# Patient Record
Sex: Male | Born: 1970 | Race: Black or African American | Hispanic: No | Marital: Single | Smoking: Never smoker
Health system: Southern US, Community
[De-identification: ages and names within clinical notes are randomized; demographics above are authoritative.]

## PROBLEM LIST (undated history)

## (undated) DIAGNOSIS — M1990 Unspecified osteoarthritis, unspecified site: Secondary | ICD-10-CM

## (undated) DIAGNOSIS — J309 Allergic rhinitis, unspecified: Secondary | ICD-10-CM

## (undated) DIAGNOSIS — M779 Enthesopathy, unspecified: Secondary | ICD-10-CM

## (undated) HISTORY — PX: HAND SURGERY: SHX662

## (undated) HISTORY — DX: Unspecified osteoarthritis, unspecified site: M19.90

## (undated) HISTORY — DX: Enthesopathy, unspecified: M77.9

## (undated) HISTORY — DX: Allergic rhinitis, unspecified: J30.9

## (undated) HISTORY — PX: BACK SURGERY: SHX140

---

## 2015-05-26 ENCOUNTER — Other Ambulatory Visit: Payer: Self-pay | Admitting: Orthopaedic Surgery

## 2015-05-26 DIAGNOSIS — M79642 Pain in left hand: Secondary | ICD-10-CM

## 2015-05-30 ENCOUNTER — Ambulatory Visit
Admission: RE | Admit: 2015-05-30 | Discharge: 2015-05-30 | Disposition: A | Discharge status: Home / Self Care | Payer: Non-veteran care | Source: Ambulatory Visit | Attending: Orthopaedic Surgery | Admitting: Orthopaedic Surgery

## 2015-05-30 DIAGNOSIS — M79642 Pain in left hand: Secondary | ICD-10-CM

## 2015-05-30 MED ORDER — GADOBENATE DIMEGLUMINE 529 MG/ML IV SOLN: 18.0000 mL | Freq: Once | INTRAVENOUS | Status: DC | PRN

## 2015-12-07 ENCOUNTER — Other Ambulatory Visit: Payer: Self-pay | Admitting: Orthopaedic Surgery

## 2015-12-07 DIAGNOSIS — M25511 Pain in right shoulder: Secondary | ICD-10-CM

## 2015-12-12 ENCOUNTER — Ambulatory Visit
Admission: RE | Admit: 2015-12-12 | Discharge: 2015-12-12 | Disposition: A | Discharge status: Home / Self Care | Payer: Non-veteran care | Source: Ambulatory Visit | Attending: Orthopaedic Surgery | Admitting: Orthopaedic Surgery

## 2015-12-12 DIAGNOSIS — M25511 Pain in right shoulder: Secondary | ICD-10-CM

## 2016-02-20 ENCOUNTER — Other Ambulatory Visit: Payer: Self-pay | Admitting: Orthopaedic Surgery

## 2016-02-20 DIAGNOSIS — M542 Cervicalgia: Secondary | ICD-10-CM

## 2016-02-28 ENCOUNTER — Ambulatory Visit
Admission: RE | Admit: 2016-02-28 | Discharge: 2016-02-28 | Disposition: A | Discharge status: Home / Self Care | Payer: Non-veteran care | Source: Ambulatory Visit | Attending: Orthopaedic Surgery | Admitting: Orthopaedic Surgery

## 2016-02-28 DIAGNOSIS — M542 Cervicalgia: Secondary | ICD-10-CM

## 2016-07-29 ENCOUNTER — Ambulatory Visit (INDEPENDENT_AMBULATORY_CARE_PROVIDER_SITE_OTHER): Payer: No Typology Code available for payment source | Admitting: Orthopaedic Surgery

## 2016-07-29 ENCOUNTER — Encounter (INDEPENDENT_AMBULATORY_CARE_PROVIDER_SITE_OTHER): Payer: Self-pay | Admitting: Orthopaedic Surgery

## 2016-07-29 DIAGNOSIS — M5412 Radiculopathy, cervical region: Secondary | ICD-10-CM | POA: Diagnosis not present

## 2016-07-29 DIAGNOSIS — M7541 Impingement syndrome of right shoulder: Secondary | ICD-10-CM

## 2016-07-29 MED ORDER — METHYLPREDNISOLONE ACETATE 40 MG/ML IJ SUSP
13.3300 mg | INTRAMUSCULAR | Status: AC | PRN
  Administered 2016-07-29: 13.33 mg via INTRA_ARTICULAR

## 2016-07-29 NOTE — Progress Notes (Signed)
   Office Visit Note   Patient: Jason Dougherty           Date of Birth: 11/11/1970           MRN: [id number] Visit Date: 07/29/2016              Requested by: No referring provider defined for this encounter. PCP: Dorene GrebeSEKHON, MANHARPRETT, MD   Assessment & Plan: Visit Diagnoses:  1. Impingement syndrome of right shoulder   2. Cervical radiculopathy     Plan: Acromioclavicular joint joint injection was performed today under sterile conditions patient tolerated this well. We'll refer to Dr. Alvester MorinNewton for another epidural steroid injection. Follow up with me as needed.  Follow-Up Instructions: Return if symptoms worsen or fail to improve.   Orders:  Orders Placed This Encounter  Procedures  . Ambulatory referral to Physical Medicine Rehab   No orders of the defined types were placed in this encounter.     Procedures: Medium Joint Inj Date/Time: 07/29/2016 6:12 PM Performed by: Tarry KosXU, NAIPING M Authorized by: Tarry KosXU, NAIPING M   Consent Given by:  Patient Indications:  Pain Location:  Shoulder Site:  R acromioclavicular Needle Size:  22 G Ultrasound Guided: No   Fluoroscopic Guidance: No   Medications:  13.33 mg methylPREDNISolone acetate 40 MG/ML     Clinical Data: No additional findings.   Subjective: Chief Complaint  Patient presents with  . Right Shoulder - Pain, Follow-up  . Neck - Pain, Follow-up    Mr. Jason Dougherty comes in for continued right shoulder pain. He had a good response to an acromioclavicular joint with Dr. Berline Choughigby in the past. He is also having pain that radiates down from his neck. He did have partial relief from an epidural steroid injection Dr. Alvester MorinNewton also. His pain is worse with turning his neck. He denies any new symptoms or trauma.    Review of Systems   Objective: Vital Signs: There were no vitals taken for this visit.  Physical Exam  Ortho Exam Exam is stable. Specialty Comments:  No specialty comments available.  Imaging: No results  found.   PMFS History: There are no active problems to display for this patient.  No past medical history on file.  No family history on file.  No past surgical history on file. Social History   Occupational History  . Not on file.   Social History Main Topics  . Smoking status: Never Smoker  . Smokeless tobacco: Never Used  . Alcohol use Not on file  . Drug use: Unknown  . Sexual activity: Not on file

## 2016-08-15 ENCOUNTER — Telehealth (INDEPENDENT_AMBULATORY_CARE_PROVIDER_SITE_OTHER): Payer: Self-pay | Admitting: Orthopaedic Surgery

## 2016-08-15 DIAGNOSIS — M542 Cervicalgia: Secondary | ICD-10-CM

## 2016-08-15 NOTE — Telephone Encounter (Signed)
Please schedule him for another ESI with newton.

## 2016-08-15 NOTE — Telephone Encounter (Signed)
See message below °

## 2016-08-15 NOTE — Telephone Encounter (Signed)
Pt got a shot in shoulder a few wks ago and saw dr Roda Shuttersxu  and stated we were suppose to schedule him another inj in neck, please advise  [phone]

## 2016-08-16 NOTE — Telephone Encounter (Signed)
Order put in.

## 2016-08-21 ENCOUNTER — Telehealth (INDEPENDENT_AMBULATORY_CARE_PROVIDER_SITE_OTHER): Payer: Self-pay | Admitting: *Deleted

## 2016-08-21 NOTE — Telephone Encounter (Signed)
Aquaphor ointment at night with cotton gloves.  Vanicream during the day

## 2016-08-21 NOTE — Telephone Encounter (Signed)
Pt called stating his hand is dry and cracking and bleeding, pt wanting some advice for this

## 2016-08-21 NOTE — Telephone Encounter (Signed)
Please advise 

## 2016-08-22 NOTE — Telephone Encounter (Signed)
Called pt to advise

## 2016-12-11 ENCOUNTER — Telehealth (INDEPENDENT_AMBULATORY_CARE_PROVIDER_SITE_OTHER): Payer: Self-pay | Admitting: Orthopaedic Surgery

## 2016-12-11 NOTE — Telephone Encounter (Signed)
[facility]

## 2016-12-12 ENCOUNTER — Telehealth (INDEPENDENT_AMBULATORY_CARE_PROVIDER_SITE_OTHER): Payer: Self-pay | Admitting: Orthopaedic Surgery

## 2016-12-12 NOTE — Telephone Encounter (Signed)
RECORDS REFAXED TO [facility] AS LESLIE EMAILED ME STATED THEY DID NOT RECEIVE RECORD THAT I FAXED ON 11/06/16

## 2016-12-23 ENCOUNTER — Telehealth (INDEPENDENT_AMBULATORY_CARE_PROVIDER_SITE_OTHER): Payer: Self-pay | Admitting: Orthopaedic Surgery

## 2016-12-23 NOTE — Telephone Encounter (Signed)
Candace @ [facility] called needing records on cervical spine, faxed to her [phone]

## 2017-07-24 ENCOUNTER — Encounter: Payer: Self-pay | Admitting: Pulmonary Disease

## 2017-07-24 ENCOUNTER — Ambulatory Visit (INDEPENDENT_AMBULATORY_CARE_PROVIDER_SITE_OTHER): Payer: Non-veteran care | Admitting: Pulmonary Disease

## 2017-07-24 ENCOUNTER — Telehealth: Payer: Self-pay | Admitting: Pulmonary Disease

## 2017-07-24 VITALS — BP 126/72 | HR 66 | Ht 67.0 in | Wt 207.0 lb

## 2017-07-24 DIAGNOSIS — R0602 Shortness of breath: Secondary | ICD-10-CM | POA: Diagnosis not present

## 2017-07-24 NOTE — Patient Instructions (Signed)
We will schedule you for pulmonary function test I have reviewed your CT which does not show any acute lung abnormality We will try to track down the [facility]. I will be in touch with you shortly Return to clinic in 1 month

## 2017-07-24 NOTE — Progress Notes (Addendum)
Jason Dougherty    [id number]    01/01/1971  Primary Care Physician:Sekhon, Manharprett, MD  Referring Physician: Dorene GrebeSekhon, Manharprett, MD [address] [facility] [place], [place] [id number]  Chief complaint: Consult for evaluation of hiccups, burping  HPI: 46 year old with past medical history of allergic rhinitis, sleep apnea.  He reports he comes in burping which started in August 2018 after steroid injection in the right shoulder.  History significant for acid reflux with esophagitis and hiatal hernia.  [facility] and has been tried on multiple antiacid medication and had a EGD done which showed mild reflux per patient.  He has been referred to pulmonary for evaluation given his ongoing symptoms.  He has symptoms of mild dyspnea with activity and at rest.  Denies any cough, sputum production, wheezing.  He had a CT scan chest abdomen pelvis on 07/18/17 but has not been told the results yet.  He has history of sleep apnea and is on CPAP for many years.  He [facility].  Pets: No pets Occupation: Was in [employer].  Deployed to [place].  Currently unemployed Exposures: No known exposures, no mold at home Smoking history: Never smoker Travel History: Not significant  Outpatient Encounter Medications as of 07/24/2017  Medication Sig  . cetirizine (ZYRTEC) 10 MG tablet Take 10 mg by mouth.  . cyclobenzaprine (FLEXERIL) 10 MG tablet Take 10 mg by mouth.  . diclofenac sodium (VOLTAREN) 1 % GEL Apply 2 g topically.  Marland Kitchen. diltiazem (CARDIZEM) 25 MG/5ML SOLN injection Place rectally.  Marland Kitchen. diltiazem 2 % GEL Please provide one cane to assist with ambulation.  Marland Kitchen. esomeprazole (NEXIUM) 40 MG capsule Take 40 mg by mouth.  . venlafaxine (EFFEXOR) 75 MG tablet Take 75 mg by mouth.  . prazosin (MINIPRESS) 2 MG capsule Take 2 mg by mouth.  . sildenafil (VIAGRA) 100 MG tablet Take 100 mg by mouth.  . tamsulosin (FLOMAX) 0.4  MG CAPS capsule Take 0.4 mg by mouth.   No facility-administered encounter medications on file as of 07/24/2017.     Allergies as of 07/24/2017 - Review Complete 07/24/2017  Allergen Reaction Noted  . Duloxetine Other (See Comments) 10/29/2016    Past Medical History:  Diagnosis Date  . Allergic rhinitis   . Arthritis   . Bone spur     Past Surgical History:  Procedure Laterality Date  . BACK SURGERY    . HAND SURGERY      Family History  Problem Relation Age of Onset  . Diabetes Mother   . Hypertension Sister   . Diabetes Maternal Grandmother     Social History   Socioeconomic History  . Marital status: Single    Spouse name: Not on file  . Number of children: Not on file  . Years of education: Not on file  . Highest education level: Not on file  Social Needs  . Financial resource strain: Not on file  . Food insecurity - worry: Not on file  . Food insecurity - inability: Not on file  . Transportation needs - medical: Not on file  . Transportation needs - non-medical: Not on file  Occupational History  . Not on file  Tobacco Use  . Smoking status: Never Smoker  . Smokeless tobacco: Never Used  Substance and Sexual Activity  . Alcohol use: No    Frequency: Never  . Drug use: No  .  Sexual activity: Not on file  Other Topics Concern  . Not on file  Social History Narrative  . Not on file   Review of systems: Review of Systems  Constitutional: Negative for fever and chills.  HENT: Negative.   Eyes: Negative for blurred vision.  Respiratory: as per HPI  Cardiovascular: Negative for chest pain and palpitations.  Gastrointestinal: Negative for vomiting, diarrhea, blood per rectum. Genitourinary: Negative for dysuria, urgency, frequency and hematuria.  Musculoskeletal: Negative for myalgias, back pain and joint pain.  Skin: Negative for itching and rash.  Neurological: Negative for dizziness, tremors, focal weakness, seizures and loss of consciousness.    Endo/Heme/Allergies: Negative for environmental allergies.  Psychiatric/Behavioral: Negative for depression, suicidal ideas and hallucinations.  All other systems reviewed and are negative.  Physical Exam: Blood pressure 126/72, pulse 66, height 5\' 7"  (1.702 m), weight 207 lb (93.9 kg), SpO2 97 %. Gen:      No acute distress HEENT:  EOMI, sclera anicteric Neck:     No masses; no thyromegaly Lungs:    Clear to auscultation bilaterally; normal respiratory effort CV:         Regular rate and rhythm; no murmurs Abd:      + bowel sounds; soft, non-tender; no palpable masses, no distension Ext:    No edema; adequate peripheral perfusion Skin:      Warm and dry; no rash Neuro: alert and oriented x 3 Psych: normal mood and affect  Data Reviewed: CT chest abdomen pelvis 07/18/17-images reviewed which do not show any acute lung abnormality.  Awaiting radiology report. CT head 04/15/17- prominent bilateral basal ganglia for patient's age.  This is of unclear significance but may indicate underlying metabolic disorder such as hypoparathyroidism.  Assessment:  Assessment for hiccups, burping Not clear if there is a lung etiology for this.  He has no smoking history or exposures and no symptoms suggestive of asthma I have reviewed the CT [facility] which he [facility] a disc.  By my review there is no lung abnormality.  There seems to be stool-filled colon in the abdomen. [facility] the [facility] to get [facility] read on this  Suspect that symptoms are from ongoing acid reflux and gastritis and [facility]  Schedule him for pulmonary function test with supine and prone spirometry to evaluate diaphragm function and a sniff test for evaluation of diaphragm movement.  Plan/Recommendations: - PFTs with supine and prone spirometry - Sniff test  Chilton GreathousePraveen Merinda Victorino MD [facility] Pager [phone] 07/24/2017, 10:42 AM  CC: Dorene GrebeSekhon,  Manharprett, MD   Addendum: [facility] No suspicious nodule, consolidative change, pleural effusion or pneumothorax is seen.  Mild enlargement of the heart Subcentimeter hypodensities within the liver too small to characterize likely small cysts or hemangiomas No bowel obstruction.  Increased stool burden is seen throughout the colon.  Prostate appears prominent in size for patient's stated age.  There is no obvious reason on the scan for patient's hiccups.  Chilton GreathousePraveen Alyannah Sanks MD  Pulmonary and Critical Care Pager [phone] If no answer or after 3pm call: [phone] 07/31/2017, 2:45 PM

## 2017-07-24 NOTE — Telephone Encounter (Signed)
Note opened in error.

## 2017-07-25 ENCOUNTER — Ambulatory Visit
Admission: RE | Admit: 2017-07-25 | Discharge: 2017-07-25 | Disposition: A | Discharge status: Home / Self Care | Payer: Self-pay | Source: Ambulatory Visit | Attending: Pulmonary Disease | Admitting: Pulmonary Disease

## 2017-07-25 DIAGNOSIS — R0602 Shortness of breath: Secondary | ICD-10-CM

## 2017-07-27 ENCOUNTER — Encounter: Payer: Self-pay | Admitting: Pulmonary Disease

## 2017-07-29 ENCOUNTER — Other Ambulatory Visit: Payer: Self-pay | Admitting: Pulmonary Disease

## 2017-07-29 ENCOUNTER — Telehealth: Payer: Self-pay | Admitting: Pulmonary Disease

## 2017-07-29 ENCOUNTER — Ambulatory Visit
Admission: RE | Admit: 2017-07-29 | Discharge: 2017-07-29 | Disposition: A | Discharge status: Home / Self Care | Payer: Self-pay | Source: Ambulatory Visit | Attending: Pulmonary Disease | Admitting: Pulmonary Disease

## 2017-07-29 DIAGNOSIS — R0602 Shortness of breath: Secondary | ICD-10-CM

## 2017-07-29 NOTE — Telephone Encounter (Signed)
I changed the order and advised Stacy at [place] CT but advised her that I couldn't delete the previous order because a message came up that the exam had began so I couldn't delete it. Nothing further is needed.

## 2017-07-30 ENCOUNTER — Other Ambulatory Visit: Payer: Self-pay

## 2017-07-30 ENCOUNTER — Telehealth: Payer: Self-pay

## 2017-07-30 DIAGNOSIS — R0602 Shortness of breath: Secondary | ICD-10-CM

## 2017-07-30 NOTE — Telephone Encounter (Signed)
Order has been place for PFT and sniff test. Pt is aware and voiced his understanding. Nothing further is needed

## 2017-07-30 NOTE — Telephone Encounter (Signed)
-----   Message from Chilton GreathousePraveen Mannam, MD sent at 07/27/2017 10:20 PM EST ----- Patient with need PFTs with supine and prone spirometry and sniff test for evaluation of diaphragm function. Please order. Thanks

## 2017-08-06 ENCOUNTER — Ambulatory Visit (HOSPITAL_COMMUNITY)
Admission: RE | Admit: 2017-08-06 | Discharge: 2017-08-06 | Disposition: A | Discharge status: Home / Self Care | Payer: Non-veteran care | Source: Ambulatory Visit | Attending: Pulmonary Disease | Admitting: Pulmonary Disease

## 2017-08-06 DIAGNOSIS — R0602 Shortness of breath: Secondary | ICD-10-CM

## 2017-08-06 LAB — PULMONARY FUNCTION TEST
DL/VA % PRED: 86 %
DL/VA: 3.86 ml/min/mmHg/L
DLCO UNC % PRED: 76 %
DLCO unc: 21.68 ml/min/mmHg
FEF 25-75 POST: 4.46 L/s
FEF 25-75 PRE: 5.35 L/s
FEF2575-%Change-Post: -16 %
FEF2575-%PRED-PRE: 170 %
FEF2575-%Pred-Post: 141 %
FEV1-%Change-Post: -5 %
FEV1-%Pred-Post: 114 %
FEV1-%Pred-Pre: 121 %
FEV1-PRE: 3.71 L
FEV1-Post: 3.48 L
FEV1FVC-%CHANGE-POST: 2 %
FEV1FVC-%Pred-Pre: 110 %
FEV6-%CHANGE-POST: -8 %
FEV6-%PRED-POST: 104 %
FEV6-%PRED-PRE: 113 %
FEV6-Post: 3.86 L
FEV6-Pre: 4.2 L
FEV6FVC-%Pred-Post: 103 %
FEV6FVC-%Pred-Pre: 103 %
FVC-%Change-Post: -8 %
FVC-%Pred-Post: 101 %
FVC-%Pred-Pre: 110 %
FVC-Post: 3.86 L
FVC-Pre: 4.2 L
POST FEV1/FVC RATIO: 90 %
POST FEV6/FVC RATIO: 100 %
PRE FEV1/FVC RATIO: 88 %
Pre FEV6/FVC Ratio: 100 %
RV % pred: 51 %
RV: 0.95 L
TLC % PRED: 83 %
TLC: 5.28 L

## 2017-08-06 MED ORDER — ALBUTEROL SULFATE (2.5 MG/3ML) 0.083% IN NEBU
2.5000 mg | INHALATION_SOLUTION | Freq: Once | RESPIRATORY_TRACT | Status: AC
  Administered 2017-08-06: 2.5 mg via RESPIRATORY_TRACT

## 2017-08-15 ENCOUNTER — Telehealth: Payer: Self-pay | Admitting: Pulmonary Disease

## 2017-08-15 ENCOUNTER — Telehealth: Payer: Self-pay | Admitting: Pharmacy Technician

## 2017-08-15 NOTE — Telephone Encounter (Signed)
PM please advise on results for xray and PFT.

## 2017-08-15 NOTE — Telephone Encounter (Signed)
Duplicate message. 

## 2017-08-19 NOTE — Telephone Encounter (Signed)
Pt is aware of results and voiced his understanding. Nothing further is needed.  

## 2017-08-19 NOTE — Telephone Encounter (Signed)
Please let the patient know that the PFTs do not show any abnormality. We were looking for diaphragm weakness as a cause of his hiccups but there is no evidence of that on the tests. I will discuss further on return visit.   Chilton GreathousePraveen Yoshio Seliga MD [place] [facility] Pager [phone] 08/19/2017, 10:35 AM

## 2017-09-02 ENCOUNTER — Ambulatory Visit (INDEPENDENT_AMBULATORY_CARE_PROVIDER_SITE_OTHER): Payer: No Typology Code available for payment source | Admitting: Pulmonary Disease

## 2017-09-02 ENCOUNTER — Encounter: Payer: Self-pay | Admitting: Pulmonary Disease

## 2017-09-02 VITALS — BP 116/80 | HR 65 | Ht 67.0 in | Wt 209.8 lb

## 2017-09-02 DIAGNOSIS — R066 Hiccough: Secondary | ICD-10-CM | POA: Diagnosis not present

## 2017-09-02 NOTE — Patient Instructions (Signed)
I have reviewed the results of your test which do not indicate any lung reason for your hiccups Please follow-up with your primary care.  You can follow-up with us as needed.

## 2017-09-02 NOTE — Progress Notes (Signed)
Jason Dougherty    [id number]    1971/08/09  Primary Care Physician:Sekhon, Manharprett, MD  Referring Physician: Dorene Grebe, MD [address] [place], [place] [address]  Chief complaint: Follow-up for hiccups, burping   HPI: 47 year old with past medical history of allergic rhinitis, sleep apnea.  He reports he comes in burping which started in August 2018 after steroid injection in the right shoulder.  History significant for acid reflux with esophagitis and hiatal hernia.  He has been referred to gastroenterology at the [facility] and has been tried on multiple antiacid medication and had a EGD done which showed mild reflux per patient.  He has been referred to pulmonary for evaluation given his ongoing symptoms.  He has symptoms of mild dyspnea with activity and at rest.  Denies any cough, sputum production, wheezing.  He had a CT scan chest abdomen pelvis on 07/18/17 but has not been told the results yet.  He has history of sleep apnea and is on CPAP for many years.  He [place] the [place]  Pets: No pets Occupation: Was in [employer].  Deployed to [place].  Currently unemployed Exposures: No known exposures, no mold at home Smoking history: Never smoker Travel History: Not significant  Outpatient Encounter Medications as of 09/02/2017  Medication Sig  . cetirizine (ZYRTEC) 10 MG tablet Take 10 mg by mouth.  . cyclobenzaprine (FLEXERIL) 10 MG tablet Take 10 mg by mouth.  . diclofenac sodium (VOLTAREN) 1 % GEL Apply 2 g topically.  Marland Kitchen diltiazem (CARDIZEM) 25 MG/5ML SOLN injection Place rectally.  Marland Kitchen diltiazem 2 % GEL Please provide one cane to assist with ambulation.  Marland Kitchen esomeprazole (NEXIUM) 40 MG capsule Take 40 mg by mouth.  . gabapentin (NEURONTIN) 300 MG capsule Take 300 mg by mouth 2 (two) times daily.  . prazosin (MINIPRESS) 2 MG capsule Take 2 mg by mouth.  . propranolol (INDERAL) 10 MG tablet Take 10 mg by mouth daily.  . sildenafil (VIAGRA)  100 MG tablet Take 100 mg by mouth.  . tamsulosin (FLOMAX) 0.4 MG CAPS capsule Take 0.4 mg by mouth.  . venlafaxine (EFFEXOR) 75 MG tablet Take 75 mg by mouth.   No facility-administered encounter medications on file as of 09/02/2017.     Allergies as of 09/02/2017 - Review Complete 09/02/2017  Allergen Reaction Noted  . Duloxetine Other (See Comments) 10/29/2016    Past Medical History:  Diagnosis Date  . Allergic rhinitis   . Arthritis   . Bone spur     Past Surgical History:  Procedure Laterality Date  . BACK SURGERY    . HAND SURGERY      Family History  Problem Relation Age of Onset  . Diabetes Mother   . Hypertension Sister   . Diabetes Maternal Grandmother     Social History   Socioeconomic History  . Marital status: Single    Spouse name: Not on file  . Number of children: Not on file  . Years of education: Not on file  . Highest education level: Not on file  Social Needs  . Financial resource strain: Not on file  . Food insecurity - worry: Not on file  . Food insecurity - inability: Not on file  . Transportation needs - medical: Not on file  . Transportation needs - non-medical: Not on file  Occupational History  . Not on file  Tobacco Use  . Smoking status: Never Smoker  .  Smokeless tobacco: Never Used  Substance and Sexual Activity  . Alcohol use: No    Frequency: Never  . Drug use: No  . Sexual activity: Not on file  Other Topics Concern  . Not on file  Social History Narrative  . Not on file   Review of systems: Review of Systems  Constitutional: Negative for fever and chills.  HENT: Negative.   Eyes: Negative for blurred vision.  Respiratory: as per HPI  Cardiovascular: Negative for chest pain and palpitations.  Gastrointestinal: Negative for vomiting, diarrhea, blood per rectum. Genitourinary: Negative for dysuria, urgency, frequency and hematuria.  Musculoskeletal: Negative for myalgias, back pain and joint pain.  Skin: Negative  for itching and rash.  Neurological: Negative for dizziness, tremors, focal weakness, seizures and loss of consciousness.  Endo/Heme/Allergies: Negative for environmental allergies.  Psychiatric/Behavioral: Negative for depression, suicidal ideas and hallucinations.  All other systems reviewed and are negative.  Physical Exam: Blood pressure 116/80, pulse 65, height 5\' 7"  (1.702 m), weight 209 lb 12.8 oz (95.2 kg), SpO2 99 %. Gen:      No acute distress HEENT:  EOMI, sclera anicteric Neck:     No masses; no thyromegaly Lungs:    Clear to auscultation bilaterally; normal respiratory effort CV:         Regular rate and rhythm; no murmurs Abd:      + bowel sounds; soft, non-tender; no palpable masses, no distension Ext:    No edema; adequate peripheral perfusion Skin:      Warm and dry; no rash Neuro: alert and oriented x 3 Psych: normal mood and affect  Data Reviewed:  CT head 04/15/17- prominent bilateral basal ganglia for patient's age.  This is of unclear significance but may indicate underlying metabolic disorder such as hypoparathyroidism.   [facility] 07[facility]18[facility]17 No suspicious nodule, consolidative change, pleural effusion or pneumothorax is seen.  Mild enlargement of the heart Subcentimeter hypodensities within the liver too small to characterize likely small cysts or hemangiomas No bowel obstruction.  Increased stool burden is seen throughout the colon.  Prostate appears prominent in size for patient's stated age. There is no obvious reason on the scan for patient's hiccups.  I reviewed the images personally  Sniff test 08/06/17-normal diaphragm movement PFTs 08/06/17- FVC 3.86 [101%], FEV1 2.48 [114%], F/F 90, TLC 83%, DLCO 76%, DLCO/VA 86% Minimal diffusion defect which corrects for alveolar volume  FVC sitting 3.77 L, FVC supine 3.57 L  [percentage change 5%], FVC prone 3.0 L [percent change 20%]  Assessment:  Assessment for hiccups, burping Not clear  if there is a lung etiology for this.  He has no smoking history or exposures and no symptoms suggestive of asthma I have reviewed the CT scan chest abdomen [facility] which does not show any lung abnormality.  There is no evidence of diaphragm dysfunction either on the sniff test or pulmonary function tests.  FVC did not change significantly from sitting to supine position. Suspect that symptoms are from ongoing acid reflux and gastritis and he follows [facility]. Recommend that he continues his antiacid medication. Can consider symptomatic treatment with baclofen, gabapentin or reglan  OSA  Stable on CPAP.  [facility].  Plan[facility]Recommendations: [facility] Return to clinic as needed.   Chilton GreathousePraveen Izyan Ezzell MD [facility] Pager [phone] 09/02/2017, 11:17 AM  CC: Dorene GrebeSekhon, Manharprett, MD

## 2021-07-18 ENCOUNTER — Inpatient Hospital Stay (HOSPITAL_COMMUNITY)
Admission: EM | Admit: 2021-07-18 | Discharge: 2021-07-24 | DRG: 418 | Disposition: A | Discharge status: Home / Self Care | Payer: No Typology Code available for payment source | Attending: Family Medicine | Admitting: Family Medicine

## 2021-07-18 ENCOUNTER — Other Ambulatory Visit: Payer: Self-pay

## 2021-07-18 ENCOUNTER — Emergency Department (HOSPITAL_COMMUNITY): Payer: No Typology Code available for payment source

## 2021-07-18 ENCOUNTER — Encounter (HOSPITAL_COMMUNITY): Payer: Self-pay | Admitting: Oncology

## 2021-07-18 DIAGNOSIS — Z79899 Other long term (current) drug therapy: Secondary | ICD-10-CM

## 2021-07-18 DIAGNOSIS — Z888 Allergy status to other drugs, medicaments and biological substances status: Secondary | ICD-10-CM

## 2021-07-18 DIAGNOSIS — D696 Thrombocytopenia, unspecified: Secondary | ICD-10-CM | POA: Diagnosis present

## 2021-07-18 DIAGNOSIS — B001 Herpesviral vesicular dermatitis: Secondary | ICD-10-CM | POA: Diagnosis present

## 2021-07-18 DIAGNOSIS — Z9114 Patient's other noncompliance with medication regimen: Secondary | ICD-10-CM

## 2021-07-18 DIAGNOSIS — Z8249 Family history of ischemic heart disease and other diseases of the circulatory system: Secondary | ICD-10-CM | POA: Diagnosis not present

## 2021-07-18 DIAGNOSIS — K851 Biliary acute pancreatitis without necrosis or infection: Principal | ICD-10-CM | POA: Diagnosis present

## 2021-07-18 DIAGNOSIS — Z20822 Contact with and (suspected) exposure to covid-19: Secondary | ICD-10-CM | POA: Diagnosis present

## 2021-07-18 DIAGNOSIS — K859 Acute pancreatitis without necrosis or infection, unspecified: Secondary | ICD-10-CM | POA: Diagnosis present

## 2021-07-18 DIAGNOSIS — K8010 Calculus of gallbladder with chronic cholecystitis without obstruction: Secondary | ICD-10-CM | POA: Diagnosis present

## 2021-07-18 DIAGNOSIS — K8020 Calculus of gallbladder without cholecystitis without obstruction: Secondary | ICD-10-CM

## 2021-07-18 DIAGNOSIS — R7989 Other specified abnormal findings of blood chemistry: Secondary | ICD-10-CM | POA: Diagnosis not present

## 2021-07-18 DIAGNOSIS — I10 Essential (primary) hypertension: Secondary | ICD-10-CM | POA: Diagnosis present

## 2021-07-18 DIAGNOSIS — D6959 Other secondary thrombocytopenia: Secondary | ICD-10-CM | POA: Diagnosis present

## 2021-07-18 DIAGNOSIS — K219 Gastro-esophageal reflux disease without esophagitis: Secondary | ICD-10-CM | POA: Diagnosis present

## 2021-07-18 LAB — URINALYSIS, ROUTINE W REFLEX MICROSCOPIC
Bacteria, UA: NONE SEEN
Glucose, UA: NEGATIVE mg/dL
Hgb urine dipstick: NEGATIVE
Leukocytes,Ua: NEGATIVE
Nitrite: NEGATIVE
Protein, ur: NEGATIVE mg/dL
Specific Gravity, Urine: <=1.005 (ref 1.005–1.030)
pH: 6.5 (ref 5.0–8.0)

## 2021-07-18 LAB — LIPASE, BLOOD: Lipase: 452 U/L — ABNORMAL HIGH (ref 11–51)

## 2021-07-18 LAB — COMPREHENSIVE METABOLIC PANEL
ALT: 533 U/L — ABNORMAL HIGH (ref 0–44)
AST: 339 U/L — ABNORMAL HIGH (ref 15–41)
Albumin: 4.1 g/dL (ref 3.5–5.0)
Alkaline Phosphatase: 159 U/L — ABNORMAL HIGH (ref 38–126)
Anion gap: 7 (ref 5–15)
BUN: 12 mg/dL (ref 6–20)
CO2: 28 mmol/L (ref 22–32)
Calcium: 9 mg/dL (ref 8.9–10.3)
Chloride: 101 mmol/L (ref 98–111)
Creatinine, Ser: 1.16 mg/dL (ref 0.61–1.24)
GFR, Estimated: >60 mL/min (ref >60)
Glucose, Bld: 110 mg/dL — ABNORMAL HIGH (ref 70–99)
Potassium: 3.9 mmol/L (ref 3.5–5.1)
Sodium: 136 mmol/L (ref 135–145)
Total Bilirubin: 6.7 mg/dL — ABNORMAL HIGH (ref 0.3–1.2)
Total Protein: 7.7 g/dL (ref 6.5–8.1)

## 2021-07-18 LAB — HEPATITIS PANEL, ACUTE
HCV Ab: NONREACTIVE
Hep A IgM: NONREACTIVE
Hep B C IgM: NONREACTIVE
Hepatitis B Surface Ag: NONREACTIVE

## 2021-07-18 LAB — CBC
HCT: 48.6 % (ref 39.0–52.0)
Hemoglobin: 16 g/dL (ref 13.0–17.0)
MCH: 30.4 pg (ref 26.0–34.0)
MCHC: 32.9 g/dL (ref 30.0–36.0)
MCV: 92.2 fL (ref 80.0–100.0)
Platelets: 183 10*3/uL (ref 150–400)
RBC: 5.27 MIL/uL (ref 4.22–5.81)
RDW: 13.2 % (ref 11.5–15.5)
WBC: 7.4 10*3/uL (ref 4.0–10.5)
nRBC: 0 % (ref 0.0–0.2)

## 2021-07-18 LAB — LIPID PANEL
Cholesterol: 152 mg/dL (ref 0–200)
HDL: 61 mg/dL (ref >40)
LDL Cholesterol: 86 mg/dL (ref 0–99)
Total CHOL/HDL Ratio: 2.5 RATIO
Triglycerides: 25 mg/dL (ref <150)
VLDL: 5 mg/dL (ref 0–40)

## 2021-07-18 LAB — RESP PANEL BY RT-PCR (FLU A&B, COVID) ARPGX2
Influenza A by PCR: NEGATIVE
Influenza B by PCR: NEGATIVE
SARS Coronavirus 2 by RT PCR: NEGATIVE

## 2021-07-18 LAB — LACTATE DEHYDROGENASE: LDH: 274 U/L — ABNORMAL HIGH (ref 98–192)

## 2021-07-18 MED ORDER — PANTOPRAZOLE SODIUM 40 MG IV SOLR
40.0000 mg | INTRAVENOUS | Status: DC
  Administered 2021-07-18 – 2021-07-23 (×6): 40 mg via INTRAVENOUS
  Filled 2021-07-18 (×6): qty 40

## 2021-07-18 MED ORDER — MORPHINE SULFATE (PF) 4 MG/ML IV SOLN: 4.0000 mg | INTRAVENOUS | Status: DC | PRN

## 2021-07-18 MED ORDER — PROCHLORPERAZINE EDISYLATE 10 MG/2ML IJ SOLN: 10.0000 mg | Freq: Four times a day (QID) | INTRAMUSCULAR | Status: DC | PRN

## 2021-07-18 MED ORDER — SODIUM CHLORIDE 0.9 % IV BOLUS
1000.0000 mL | Freq: Once | INTRAVENOUS | Status: AC
  Administered 2021-07-18: 1000 mL via INTRAVENOUS

## 2021-07-18 MED ORDER — ACETAMINOPHEN 325 MG PO TABS
650.0000 mg | ORAL_TABLET | Freq: Four times a day (QID) | ORAL | Status: DC | PRN
  Administered 2021-07-23 – 2021-07-24 (×3): 650 mg via ORAL
  Filled 2021-07-18 (×3): qty 2

## 2021-07-18 MED ORDER — MORPHINE SULFATE (PF) 4 MG/ML IV SOLN
4.0000 mg | Freq: Once | INTRAVENOUS | Status: AC
  Administered 2021-07-18: 4 mg via INTRAVENOUS
  Filled 2021-07-18: qty 1

## 2021-07-18 MED ORDER — HYDRALAZINE HCL 20 MG/ML IJ SOLN: 10.0000 mg | Freq: Three times a day (TID) | INTRAMUSCULAR | Status: DC | PRN

## 2021-07-18 MED ORDER — SODIUM CHLORIDE (PF) 0.9 % IJ SOLN
INTRAMUSCULAR | Status: AC
  Filled 2021-07-18: qty 50

## 2021-07-18 MED ORDER — IOHEXOL 350 MG/ML SOLN
75.0000 mL | Freq: Once | INTRAVENOUS | Status: AC | PRN
  Administered 2021-07-18: 75 mL via INTRAVENOUS

## 2021-07-18 MED ORDER — ACETAMINOPHEN 650 MG RE SUPP: 650.0000 mg | Freq: Four times a day (QID) | RECTAL | Status: DC | PRN

## 2021-07-18 MED ORDER — SODIUM CHLORIDE 0.9 % IV SOLN: INTRAVENOUS | Status: DC

## 2021-07-18 NOTE — ED Provider Notes (Signed)
[facility]-EMERGENCY DEPT Provider Note   CSN: [id number] Arrival date & time: 07/18/21  4034     History Chief Complaint  Patient presents with   Abdominal Pain    Jason Dougherty is a 50 y.o. male.  HPI 50 year old male presents with abdominal pain.  Started yesterday morning and was pretty severe and in his back.  Is not as bad today but he went to the [place] where they drew blood work and said that his liver enzymes were abnormal and he should come to the emergency department.  Right now his pain is about a 5 out of 10 in the middle of his abdomen.  He denies vomiting.  He has been having normal bowel movements up until he got here he had 1 loose bowel movement.  He has never had any liver problems before.  He denies any significant Tylenol use.  Past Medical History:  Diagnosis Date   Allergic rhinitis    Arthritis    Bone spur     Patient Active Problem List   Diagnosis Date Noted   Acute pancreatitis 07/18/2021    Past Surgical History:  Procedure Laterality Date   BACK SURGERY     HAND SURGERY         Family History  Problem Relation Age of Onset   Diabetes Mother    Hypertension Sister    Diabetes Maternal Grandmother     Social History   Tobacco Use   Smoking status: Never   Smokeless tobacco: Never  Vaping Use   Vaping Use: Never used  Substance Use Topics   Alcohol use: No   Drug use: No    Home Medications Prior to Admission medications   Medication Sig Start Date End Date Taking? Authorizing Provider  cetirizine (ZYRTEC) 10 MG tablet Take 10 mg by mouth.    [provider]  cyclobenzaprine (FLEXERIL) 10 MG tablet Take 10 mg by mouth.    [provider]  diclofenac sodium (VOLTAREN) 1 % GEL Apply 2 g topically.    [provider]  diltiazem (CARDIZEM) 25 MG/5ML SOLN injection Place rectally.    [provider]  diltiazem 2 % GEL Please provide one cane to assist with ambulation. 09/05/16    [provider]  esomeprazole (NEXIUM) 40 MG capsule Take 40 mg by mouth.    [provider]  gabapentin (NEURONTIN) 300 MG capsule Take 300 mg by mouth 2 (two) times daily.    [provider]  prazosin (MINIPRESS) 2 MG capsule Take 2 mg by mouth.    [provider]  propranolol (INDERAL) 10 MG tablet Take 10 mg by mouth daily.    [provider]  sildenafil (VIAGRA) 100 MG tablet Take 100 mg by mouth.    [provider]  tamsulosin (FLOMAX) 0.4 MG CAPS capsule Take 0.4 mg by mouth.    [provider]  venlafaxine (EFFEXOR) 75 MG tablet Take 75 mg by mouth.    [provider]    Allergies    Duloxetine  Review of Systems   Review of Systems  Constitutional:  Negative for fever.  Gastrointestinal:  Positive for abdominal pain and diarrhea (one episode). Negative for vomiting.  Musculoskeletal:  Positive for back pain.  All other systems reviewed and are negative.  Physical Exam Updated Vital Signs BP (!) 140/94   Pulse 78   Temp 98.6 F (37 C) (Oral)   Resp 18   Ht 5' 6.5" (1.689  m)   Wt 88.5 kg   SpO2 98%   BMI 31.00 kg/m   Physical Exam Vitals and nursing note reviewed.  Constitutional:      Appearance: He is well-developed.  HENT:     Head: Normocephalic and atraumatic.     Right Ear: External ear normal.     Left Ear: External ear normal.     Nose: Nose normal.  Eyes:     General:        Right eye: No discharge.        Left eye: No discharge.  Cardiovascular:     Rate and Rhythm: Normal rate and regular rhythm.     Heart sounds: Normal heart sounds.  Pulmonary:     Effort: Pulmonary effort is normal.     Breath sounds: Normal breath sounds.  Abdominal:     Palpations: Abdomen is soft.     Tenderness: There is abdominal tenderness in the right lower quadrant, epigastric area, left upper quadrant and left lower quadrant. There is guarding.  Musculoskeletal:     Cervical back: Neck supple.   Skin:    General: Skin is warm and dry.  Neurological:     Mental Status: He is alert.  Psychiatric:        Mood and Affect: Mood is not anxious.    ED Results / Procedures / Treatments   Labs (all labs ordered are listed, but only abnormal results are displayed) Labs Reviewed  LIPASE, BLOOD - Abnormal; Notable for the following components:      Result Value   Lipase 452 (*)    All other components within normal limits  COMPREHENSIVE METABOLIC PANEL - Abnormal; Notable for the following components:   Glucose, Bld 110 (*)    AST 339 (*)    ALT 533 (*)    Alkaline Phosphatase 159 (*)    Total Bilirubin 6.7 (*)    All other components within normal limits  LACTATE DEHYDROGENASE - Abnormal; Notable for the following components:   LDH 274 (*)    All other components within normal limits  RESP PANEL BY RT-PCR (FLU A&B, COVID) ARPGX2  CBC  LIPID PANEL  URINALYSIS, ROUTINE W REFLEX MICROSCOPIC  ANA W/REFLEX IF POSITIVE  HEPATITIS PANEL, ACUTE    EKG None  Radiology CT ABDOMEN PELVIS W CONTRAST  Result Date: 07/18/2021 CLINICAL DATA:  Pancreatitis suspected.  Abdominal pain EXAM: CT ABDOMEN AND PELVIS WITH CONTRAST TECHNIQUE: Multidetector CT imaging of the abdomen and pelvis was performed using the standard protocol following bolus administration of intravenous contrast. CONTRAST:  26mL OMNIPAQUE IOHEXOL 350 MG/ML SOLN COMPARISON:  Outside exam dated 07/18/2017 FINDINGS: Lower chest: Unremarkable. Hepatobiliary: No suspicious focal abnormality within the liver parenchyma. Possible trace pericholecystic fluid. No evidence for calcified gallstones. No intrahepatic or extrahepatic biliary dilation. Pancreas: No main pancreatic duct dilatation. Pancreatic tail appears edematous with peripancreatic edema. Pancreatic parenchyma enhances throughout. Spleen: No splenomegaly. No focal mass lesion. Adrenals/Urinary Tract: No adrenal nodule or mass. Kidneys unremarkable. No evidence for  hydroureter. The urinary bladder appears normal for the degree of distention. Stomach/Bowel: Moderate distention of the stomach with fluid. There is edema around the second and third portions of the duodenum. No small bowel wall thickening. No small bowel dilatation. The terminal ileum is normal. The appendix is normal. No gross colonic mass. No colonic wall thickening. Vascular/Lymphatic: No abdominal aortic aneurysm There is no gastrohepatic or hepatoduodenal ligament lymphadenopathy. No retroperitoneal or mesenteric lymphadenopathy. No pelvic sidewall lymphadenopathy. Reproductive: The  prostate gland and seminal vesicles are unremarkable. Other: Retroperitoneal edema in the abdomen tracks down towards the pelvis. Trace perihepatic fluid evident. Musculoskeletal: No worrisome lytic or sclerotic osseous abnormality. Degenerative changes noted at the L5-S1 disc. IMPRESSION: 1. Retroperitoneal edema with edema/inflammation around the pancreatic tail and second and third portions of the duodenum. Imaging features could be compatible with acute pancreatitis. No evidence for pancreatic necrosis. No main pancreatic duct dilatation. Duodenitis would also be a consideration. 2. Possible trace pericholecystic fluid. No evidence for calcified gallstones. If there is clinical concern for acute cholecystitis, abdominal ultrasound may prove helpful to further evaluate. 3. Trace perihepatic fluid. Electronically Signed   By: Kennith Center M.D.   On: 07/18/2021 13:35   US Abdomen Limited RUQ (LIVER/GB)  Result Date: 07/18/2021 CLINICAL DATA:  Acute pancreatitis, elevated LFTs EXAM: ULTRASOUND ABDOMEN LIMITED RIGHT UPPER QUADRANT COMPARISON:  07/18/2021 FINDINGS: Gallbladder: Multiple echogenic shadowing gallstones noted, largest measuring 1.7 cm. Proximal gallbladder wall is thickened to 4.6 mm anteriorly. Despite this, no sonographic Murphy's sign elicited or pericholecystic fluid. Common bile duct: Diameter: 3 mm. Liver: No  focal lesion identified. Within normal limits in parenchymal echogenicity. Portal vein is patent on color Doppler imaging with normal direction of blood flow towards the liver. Other: Trace perihepatic free fluid. IMPRESSION: Cholelithiasis with gallbladder wall thickening but no associated Murphy's sign or pericholecystic fluid. Findings remain nonspecific. Trace perihepatic ascites Electronically Signed   By: Judie Petit.  Shick M.D.   On: 07/18/2021 14:42    Procedures Procedures   Medications Ordered in ED Medications  sodium chloride (PF) 0.9 % injection (has no administration in time range)  morphine 4 MG/ML injection 4 mg (4 mg Intravenous Given 07/18/21 1227)  iohexol (OMNIPAQUE) 350 MG/ML injection 75 mL (75 mLs Intravenous Contrast Given 07/18/21 1310)  sodium chloride 0.9 % bolus 1,000 mL (0 mLs Intravenous Stopped 07/18/21 1439)    ED Course  I have reviewed the triage vital signs and the nursing notes.  Pertinent labs & imaging results that were available during my care of the patient were reviewed by me and considered in my medical decision making (see chart for details).    MDM Rules/Calculators/A&P                           Pain is better after morphine.  CT shows impressive pancreatitis but no obvious cause.  No biliary dilatation.  No obvious cholecystitis.  Right upper quadrant ultrasound was ordered after discussing with gastroenterology, Dr. Dulce Sellar.  GI will consult and I have admitted him to the hospitalist service. Final Clinical Impression(s) / ED Diagnoses Final diagnoses:  Acute pancreatitis    Rx / DC Orders ED Discharge Orders     None        Pricilla Loveless, MD 07/18/21 1708

## 2021-07-18 NOTE — H&P (Signed)
History and Physical    FRANCESCO PROVENCAL MPN:[id number] DOB: [birth year] DOA: 07/18/2021  PCP: [facility]  Patient coming from: Home  Chief Complaint: stomach pain  HPI: JAXX HUISH is a 50 y.o. male with medical history significant of GERD, HTN. Presenting with abdominal pain. He's had symptoms intermittently over the last several months. However, his symptoms worsened yesterday. He woke up with intense pain in his epigastric area around 5am. He had a BM and tried some pain meds, but the pain did not stop. He had nausea, but no vomiting. He didn't have diarrhea or fever. The pain intensified and remained constant throughout the day. So he went to the [place]. They noted that he had elevated LFTs, so they sent him to the ED. He denies any other aggravating or alleviating factors.    ED Course: CT was notable for pancreatitis. No necrosis or abscess seen. GI was consulted. TRH was called for admission.   Review of Systems:  Denies CP, dyspnea, palpitations. Vomiting, fever, diarrhea, sick contacts, alcohol use. Review of systems is otherwise negative for all not mentioned in HPI.   PMHx Past Medical History:  Diagnosis Date   Allergic rhinitis    Arthritis    Bone spur     PSHx Past Surgical History:  Procedure Laterality Date   BACK SURGERY     HAND SURGERY      SocHx  reports that he has never smoked. He has never used smokeless tobacco. He reports that he does not drink alcohol and does not use drugs.  Allergies  Allergen Reactions   Duloxetine Other (See Comments)    Nosebleed, cognitive changes    FamHx Family History  Problem Relation Age of Onset   Diabetes Mother    Hypertension Sister    Diabetes Maternal Grandmother     Prior to Admission medications   Medication Sig Start Date End Date Taking? Authorizing Provider  cetirizine (ZYRTEC) 10 MG tablet Take 10 mg by mouth.    [provider]  cyclobenzaprine (FLEXERIL) 10 MG tablet Take 10 mg by  mouth.    [provider]  diclofenac sodium (VOLTAREN) 1 % GEL Apply 2 g topically.    [provider]  diltiazem (CARDIZEM) 25 MG/5ML SOLN injection Place rectally.    [provider]  diltiazem 2 % GEL Please provide one cane to assist with ambulation. 09/05/16   [provider]  esomeprazole (NEXIUM) 40 MG capsule Take 40 mg by mouth.    [provider]  gabapentin (NEURONTIN) 300 MG capsule Take 300 mg by mouth 2 (two) times daily.    [provider]  prazosin (MINIPRESS) 2 MG capsule Take 2 mg by mouth.    [provider]  propranolol (INDERAL) 10 MG tablet Take 10 mg by mouth daily.    [provider]  sildenafil (VIAGRA) 100 MG tablet Take 100 mg by mouth.    [provider]  tamsulosin (FLOMAX) 0.4 MG CAPS capsule Take 0.4 mg by mouth.    [provider]  venlafaxine (EFFEXOR) 75 MG tablet Take 75 mg by mouth.    [provider]    Physical Exam: Vitals:   07/18/21 1027 07/18/21 1245 07/18/21 1300 07/18/21 1329  BP:   (!) 140/94 (!) 140/94  Pulse:  77 77 78  Resp:    18  Temp:      TempSrc:      SpO2:  99% 97% 98%  Weight: 88.5 kg  Height: 5' 6.5" (1.689 m)       General: 50 y.o. male resting in bed in NAD Eyes: PERRL, normal sclera ENMT: Nares patent w/o discharge, orophaynx clear, dentition normal, ears w/o discharge/lesions/ulcers Neck: Supple, trachea midline Cardiovascular: RRR, +S1, S2, no m/g/r, equal pulses throughout Respiratory: CTABL, no w/r/r, normal WOB GI: BS+, mild distention, but soft, mild tenderness to deep palpation of LUQ/epigastric area, no masses noted, no organomegaly noted MSK: No e/c/c Neuro: A&O x 3, no focal deficits Psyc: Appropriate interaction and affect, calm/cooperative  Labs on Admission: I have personally reviewed following labs and imaging studies  CBC: Recent Labs  Lab 07/18/21 1057  WBC 7.4  HGB 16.0  HCT 48.6  MCV 92.2  PLT  183   Basic Metabolic Panel: Recent Labs  Lab 07/18/21 1057  NA 136  K 3.9  CL 101  CO2 28  GLUCOSE 110*  BUN 12  CREATININE 1.16  CALCIUM 9.0   GFR: Estimated Creatinine Clearance: 78.4 mL/min (by C-G formula based on SCr of 1.16 mg/dL). Liver Function Tests: Recent Labs  Lab 07/18/21 1057  AST 339*  ALT 533*  ALKPHOS 159*  BILITOT 6.7*  PROT 7.7  ALBUMIN 4.1   Recent Labs  Lab 07/18/21 1057  LIPASE 452*   No results for input(s): AMMONIA in the last 168 hours. Coagulation Profile: No results for input(s): INR, PROTIME in the last 168 hours. Cardiac Enzymes: No results for input(s): CKTOTAL, CKMB, CKMBINDEX, TROPONINI in the last 168 hours. BNP (last 3 results) No results for input(s): PROBNP in the last 8760 hours. HbA1C: No results for input(s): HGBA1C in the last 72 hours. CBG: No results for input(s): GLUCAP in the last 168 hours. Lipid Profile: No results for input(s): CHOL, HDL, LDLCALC, TRIG, CHOLHDL, LDLDIRECT in the last 72 hours. Thyroid Function Tests: No results for input(s): TSH, T4TOTAL, FREET4, T3FREE, THYROIDAB in the last 72 hours. Anemia Panel: No results for input(s): VITAMINB12, FOLATE, FERRITIN, TIBC, IRON, RETICCTPCT in the last 72 hours. Urine analysis: No results found for: COLORURINE, APPEARANCEUR, LABSPEC, PHURINE, GLUCOSEU, HGBUR, BILIRUBINUR, KETONESUR, PROTEINUR, UROBILINOGEN, NITRITE, LEUKOCYTESUR  Radiological Exams on Admission: CT ABDOMEN PELVIS W CONTRAST  Result Date: 07/18/2021 CLINICAL DATA:  Pancreatitis suspected.  Abdominal pain EXAM: CT ABDOMEN AND PELVIS WITH CONTRAST TECHNIQUE: Multidetector CT imaging of the abdomen and pelvis was performed using the standard protocol following bolus administration of intravenous contrast. CONTRAST:  34mL OMNIPAQUE IOHEXOL 350 MG/ML SOLN COMPARISON:  Outside exam dated 07/18/2017 FINDINGS: Lower chest: Unremarkable. Hepatobiliary: No suspicious focal abnormality within the liver  parenchyma. Possible trace pericholecystic fluid. No evidence for calcified gallstones. No intrahepatic or extrahepatic biliary dilation. Pancreas: No main pancreatic duct dilatation. Pancreatic tail appears edematous with peripancreatic edema. Pancreatic parenchyma enhances throughout. Spleen: No splenomegaly. No focal mass lesion. Adrenals/Urinary Tract: No adrenal nodule or mass. Kidneys unremarkable. No evidence for hydroureter. The urinary bladder appears normal for the degree of distention. Stomach/Bowel: Moderate distention of the stomach with fluid. There is edema around the second and third portions of the duodenum. No small bowel wall thickening. No small bowel dilatation. The terminal ileum is normal. The appendix is normal. No gross colonic mass. No colonic wall thickening. Vascular/Lymphatic: No abdominal aortic aneurysm There is no gastrohepatic or hepatoduodenal ligament lymphadenopathy. No retroperitoneal or mesenteric lymphadenopathy. No pelvic sidewall lymphadenopathy. Reproductive: The prostate gland and seminal vesicles are unremarkable. Other: Retroperitoneal edema in the abdomen tracks down towards the pelvis. Trace perihepatic fluid evident. Musculoskeletal: No worrisome lytic or  sclerotic osseous abnormality. Degenerative changes noted at the L5-S1 disc. IMPRESSION: 1. Retroperitoneal edema with edema/inflammation around the pancreatic tail and second and third portions of the duodenum. Imaging features could be compatible with acute pancreatitis. No evidence for pancreatic necrosis. No main pancreatic duct dilatation. Duodenitis would also be a consideration. 2. Possible trace pericholecystic fluid. No evidence for calcified gallstones. If there is clinical concern for acute cholecystitis, abdominal ultrasound may prove helpful to further evaluate. 3. Trace perihepatic fluid. Electronically Signed   By: Kennith Center M.D.   On: 07/18/2021 13:35    EKG: None obtained in  ED  Assessment/Plan Acute pancreatitis     - admit to inpt, tele     - NPO except sips/ice chips     - fluids, pain control, anti-emetics     - [facility] onboard, appreciate assistance     - RUQ ab [place] ordered     - check lipids  Elevated LFTs     - follow RUQ [place] ab     - check hepatitis panel     - trend LFTs  HTN     - non-compliant on medications     - will have PRN for now  GERD     - PPI  DVT prophylaxis: SCDs  Code Status: FULL  Family Communication: None at bedside  Consults called: EDP spoke with [facility]   Status is: Inpatient  Remains inpatient appropriate because: severity of illness   Teddy Spike DO [facility]  If 7PM-7AM, please contact night-coverage [email or web address]  07/18/2021, 2:31 PM

## 2021-07-18 NOTE — Consult Note (Signed)
Referring Provider: ED Primary Care Physician:  [facility] Primary Gastroenterologist:  Althia Forts  Reason for Consultation:  Acute Pancreatitis  HPI: Jason Dougherty is a 50 y.o. male no significant past medical history presents with epigastric pain.  Patient states he has had intermittent epigastric pain ongoing for months. Occurs mainly at night, will often wake him up from sleep. Denies any identifiable triggers, not associated with eating. States yesterday the pain acutely worsened and became constant. He also reports dark urine. Denies alcohol or tobacco use. Was seen at the [place] where they drew blood and informed him his LFTs were elevated and to be evaluated at the ED. Struggles with constipation. States his last colonoscopy was about 2-3 years ago at the [place], denies polyps. Denies previous episodes of pancreatitis or family history of GI issues. Denies melena/hematochezia.   Past Medical History:  Diagnosis Date   Allergic rhinitis    Arthritis    Bone spur     Past Surgical History:  Procedure Laterality Date   BACK SURGERY     HAND SURGERY      Prior to Admission medications   Medication Sig Start Date End Date Taking? Authorizing Provider  cetirizine (ZYRTEC) 10 MG tablet Take 10 mg by mouth.    [provider]  cyclobenzaprine (FLEXERIL) 10 MG tablet Take 10 mg by mouth.    [provider]  diclofenac sodium (VOLTAREN) 1 % GEL Apply 2 g topically.    [provider]  diltiazem (CARDIZEM) 25 MG/5ML SOLN injection Place rectally.    [provider]  diltiazem 2 % GEL Please provide one cane to assist with ambulation. 09/05/16   [provider]  esomeprazole (NEXIUM) 40 MG capsule Take 40 mg by mouth.    [provider]  gabapentin (NEURONTIN) 300 MG capsule Take 300 mg by mouth 2 (two) times daily.    [provider]  prazosin (MINIPRESS) 2 MG capsule Take 2 mg by mouth.    [provider]   propranolol (INDERAL) 10 MG tablet Take 10 mg by mouth daily.    [provider]  sildenafil (VIAGRA) 100 MG tablet Take 100 mg by mouth.    [provider]  tamsulosin (FLOMAX) 0.4 MG CAPS capsule Take 0.4 mg by mouth.    [provider]  venlafaxine (EFFEXOR) 75 MG tablet Take 75 mg by mouth.    [provider]    Scheduled Meds:  sodium chloride (PF)       Continuous Infusions: PRN Meds:.  Allergies as of 07/18/2021 - Review Complete 07/18/2021  Allergen Reaction Noted   Duloxetine Other (See Comments) 10/29/2016    Family History  Problem Relation Age of Onset   Diabetes Mother    Hypertension Sister    Diabetes Maternal Grandmother     Social History   Socioeconomic History   Marital status: Single    Spouse name: Not on file   Number of children: Not on file   Years of education: Not on file   Highest education level: Not on file  Occupational History   Not on file  Tobacco Use   Smoking status: Never   Smokeless tobacco: Never  Vaping Use   Vaping Use: Never used  Substance and Sexual Activity   Alcohol use: No   Drug use: No   Sexual activity: Not on file  Other Topics Concern   Not on file  Social History Narrative   Not on file  Social Determinants of Health   Financial Resource Strain: Not on file  Food Insecurity: Not on file  Transportation Needs: Not on file  Physical Activity: Not on file  Stress: Not on file  Social Connections: Not on file  Intimate Partner Violence: Not on file    Review of Systems: Review of Systems  Constitutional:  Negative for chills and fever.  HENT:  Negative for ear discharge and ear pain.   Eyes:  Negative for blurred vision and double vision.  Respiratory:  Negative for cough and hemoptysis.   Cardiovascular:  Negative for chest pain and palpitations.  Gastrointestinal:  Positive for abdominal pain and constipation. Negative for blood in stool, diarrhea, heartburn,  melena, nausea and vomiting.  Genitourinary:  Negative for frequency.       Dark urine  Musculoskeletal:  Negative for falls and joint pain.  Skin:  Negative for itching and rash.  Neurological:  Negative for seizures and loss of consciousness.  Psychiatric/Behavioral:  Negative for substance abuse. The patient is not nervous/anxious.     Physical Exam:Physical Exam Constitutional:      Appearance: He is well-developed.  HENT:     Head: Normocephalic and atraumatic.     Nose: Nose normal. No congestion.     Mouth/Throat:     Mouth: Mucous membranes are moist.     Pharynx: Oropharynx is clear.  Eyes:     General: Scleral icterus present.     Extraocular Movements: Extraocular movements intact.  Cardiovascular:     Rate and Rhythm: Normal rate and regular rhythm.  Pulmonary:     Effort: Pulmonary effort is normal. No respiratory distress.  Abdominal:     General: Bowel sounds are normal. There is distension.     Palpations: There is no mass.     Tenderness: There is no abdominal tenderness. There is no guarding or rebound.     Hernia: No hernia is present.  Musculoskeletal:        General: No swelling. Normal range of motion.     Cervical back: Normal range of motion and neck supple.  Skin:    General: Skin is warm and dry.  Neurological:     General: No focal deficit present.     Mental Status: He is alert and oriented to person, place, and time.  Psychiatric:        Mood and Affect: Mood normal.        Behavior: Behavior normal.        Thought Content: Thought content normal.        Judgment: Judgment normal.    Vital signs: Vitals:   07/18/21 1300 07/18/21 1329  BP: (!) 140/94 (!) 140/94  Pulse: 77 78  Resp:  18  Temp:    SpO2: 97% 98%        GI:  Lab Results: Recent Labs    07/18/21 1057  WBC 7.4  HGB 16.0  HCT 48.6  PLT 183   BMET Recent Labs    07/18/21 1057  NA 136  K 3.9  CL 101  CO2 28  GLUCOSE 110*  BUN 12  CREATININE 1.16  CALCIUM  9.0   LFT Recent Labs    07/18/21 1057  PROT 7.7  ALBUMIN 4.1  AST 339*  ALT 533*  ALKPHOS 159*  BILITOT 6.7*   PT/INR No results for input(s): LABPROT, INR in the last 72 hours.   Studies/Results: CT ABDOMEN PELVIS W CONTRAST  Result Date: 07/18/2021 CLINICAL DATA:  Pancreatitis suspected.  Abdominal pain EXAM: CT ABDOMEN AND PELVIS WITH CONTRAST TECHNIQUE: Multidetector CT imaging of the abdomen and pelvis was performed using the standard protocol following bolus administration of intravenous contrast. CONTRAST:  43m OMNIPAQUE IOHEXOL 350 MG/ML SOLN COMPARISON:  Outside exam dated 07/18/2017 FINDINGS: Lower chest: Unremarkable. Hepatobiliary: No suspicious focal abnormality within the liver parenchyma. Possible trace pericholecystic fluid. No evidence for calcified gallstones. No intrahepatic or extrahepatic biliary dilation. Pancreas: No main pancreatic duct dilatation. Pancreatic tail appears edematous with peripancreatic edema. Pancreatic parenchyma enhances throughout. Spleen: No splenomegaly. No focal mass lesion. Adrenals/Urinary Tract: No adrenal nodule or mass. Kidneys unremarkable. No evidence for hydroureter. The urinary bladder appears normal for the degree of distention. Stomach/Bowel: Moderate distention of the stomach with fluid. There is edema around the second and third portions of the duodenum. No small bowel wall thickening. No small bowel dilatation. The terminal ileum is normal. The appendix is normal. No gross colonic mass. No colonic wall thickening. Vascular/Lymphatic: No abdominal aortic aneurysm There is no gastrohepatic or hepatoduodenal ligament lymphadenopathy. No retroperitoneal or mesenteric lymphadenopathy. No pelvic sidewall lymphadenopathy. Reproductive: The prostate gland and seminal vesicles are unremarkable. Other: Retroperitoneal edema in the abdomen tracks down towards the pelvis. Trace perihepatic fluid evident. Musculoskeletal: No worrisome lytic or  sclerotic osseous abnormality. Degenerative changes noted at the L5-S1 disc. IMPRESSION: 1. Retroperitoneal edema with edema/inflammation around the pancreatic tail and second and third portions of the duodenum. Imaging features could be compatible with acute pancreatitis. No evidence for pancreatic necrosis. No main pancreatic duct dilatation. Duodenitis would also be a consideration. 2. Possible trace pericholecystic fluid. No evidence for calcified gallstones. If there is clinical concern for acute cholecystitis, abdominal ultrasound may prove helpful to further evaluate. 3. Trace perihepatic fluid. Electronically Signed   By: EMisty StanleyM.D.   On: 07/18/2021 13:35    Impression: Acute Pancreatitis:likely secondary to gallstones - CT ab/pelvis with contrast 12/7: significant edema around pancreatic tail and 2nd/3rd portions of duodenum. No pancreatic duct dilation. Pericholecystic fluid, no gallstones. Possible acute cholecystitis. - No leukocytosis - AST 339/ ALT 533/ Alk Phos 159 - T. Bili: 6.7 - normal renal function - lipase 452  Dark urine - likely secondary to hyperbilirubinemia.  Plan: Worsening "attacks" of intermittent epigastric pain at night, were possibly small gallbladder attacks. Now having constant pain with elevated LFTs. CT shows edematous pancreas and pericholecystic fluid. [place]. If positive for cholelithiasis, will consult surgery. Pending [place], may also get MRCP to evaluate for choledocholithiasis.  Continue aggressive IVF Continue supportive care and pain management. NPO except ice chips [facility] will follow  LOS: 0 days   Aarin Sparkman MRadford Pax PA-C 07/18/2021, 1:51 PM  Contact #  [phone]

## 2021-07-18 NOTE — ED Triage Notes (Signed)
Pt reports abdominal pain that began yesterday.  States the [place] told him his liver enzymes were abnormal and he should present to ER for evaluation this morning.

## 2021-07-19 ENCOUNTER — Inpatient Hospital Stay (HOSPITAL_COMMUNITY): Payer: No Typology Code available for payment source

## 2021-07-19 DIAGNOSIS — K851 Biliary acute pancreatitis without necrosis or infection: Principal | ICD-10-CM

## 2021-07-19 LAB — COMPREHENSIVE METABOLIC PANEL
ALT: 320 U/L — ABNORMAL HIGH (ref 0–44)
AST: 139 U/L — ABNORMAL HIGH (ref 15–41)
Albumin: 3.4 g/dL — ABNORMAL LOW (ref 3.5–5.0)
Alkaline Phosphatase: 129 U/L — ABNORMAL HIGH (ref 38–126)
Anion gap: 8 (ref 5–15)
BUN: 15 mg/dL (ref 6–20)
CO2: 23 mmol/L (ref 22–32)
Calcium: 8.4 mg/dL — ABNORMAL LOW (ref 8.9–10.3)
Chloride: 104 mmol/L (ref 98–111)
Creatinine, Ser: 1.12 mg/dL (ref 0.61–1.24)
GFR, Estimated: >60 mL/min (ref >60)
Glucose, Bld: 92 mg/dL (ref 70–99)
Potassium: 3.7 mmol/L (ref 3.5–5.1)
Sodium: 135 mmol/L (ref 135–145)
Total Bilirubin: 6.8 mg/dL — ABNORMAL HIGH (ref 0.3–1.2)
Total Protein: 6.7 g/dL (ref 6.5–8.1)

## 2021-07-19 LAB — CBC
HCT: 41.4 % (ref 39.0–52.0)
Hemoglobin: 13.9 g/dL (ref 13.0–17.0)
MCH: 31.2 pg (ref 26.0–34.0)
MCHC: 33.6 g/dL (ref 30.0–36.0)
MCV: 92.8 fL (ref 80.0–100.0)
Platelets: 161 10*3/uL (ref 150–400)
RBC: 4.46 MIL/uL (ref 4.22–5.81)
RDW: 13.3 % (ref 11.5–15.5)
WBC: 9.7 10*3/uL (ref 4.0–10.5)
nRBC: 0 % (ref 0.0–0.2)

## 2021-07-19 LAB — LIPASE, BLOOD: Lipase: 118 U/L — ABNORMAL HIGH (ref 11–51)

## 2021-07-19 LAB — LACTATE DEHYDROGENASE: LDH: 197 U/L — ABNORMAL HIGH (ref 98–192)

## 2021-07-19 LAB — HIV ANTIBODY (ROUTINE TESTING W REFLEX): HIV Screen 4th Generation wRfx: NONREACTIVE

## 2021-07-19 MED ORDER — LIP MEDEX EX OINT
TOPICAL_OINTMENT | CUTANEOUS | Status: AC
  Administered 2021-07-19: 75
  Filled 2021-07-19: qty 7

## 2021-07-19 MED ORDER — OXYCODONE HCL 5 MG PO TABS: 5.0000 mg | ORAL_TABLET | Freq: Four times a day (QID) | ORAL | Status: DC | PRN

## 2021-07-19 MED ORDER — AMLODIPINE BESYLATE 5 MG PO TABS
5.0000 mg | ORAL_TABLET | Freq: Every day | ORAL | Status: DC
  Administered 2021-07-19 – 2021-07-24 (×5): 5 mg via ORAL
  Filled 2021-07-19 (×5): qty 1

## 2021-07-19 MED ORDER — GADOBUTROL 1 MMOL/ML IV SOLN
10.0000 mL | Freq: Once | INTRAVENOUS | Status: AC | PRN
  Administered 2021-07-19: 10 mL via INTRAVENOUS

## 2021-07-19 NOTE — Progress Notes (Signed)
Patient has working IV in right hand, but is requesting IV be moved to left hand. Unit RN stated the vessels in his left hand are small and tortuous; she attempted IV placement in left hand, but was unsuccessful. VAST RN to attempt PIV placement later today once patient returns from MRI.

## 2021-07-19 NOTE — Progress Notes (Addendum)
PROGRESS NOTE   Jason Dougherty  XLK:[id number]    DOB: 19-Sep-1970    DOA: 07/18/2021  PCP: [facility]   I have briefly reviewed patients previous medical records in [facility].  Chief Complaint  Patient presents with   Abdominal Pain    Brief Narrative:  50 year old male with medical history significant for GERD, hypertension, history of intermittent abdominal pain for almost a year, presented to the ED with intense epigastric abdominal pain that started on 12/6 morning, unrelieved by a BM and some pain meds, associated with nausea but no vomiting, went to the [place] where he was noted to have elevated LFTs and hence sent to the ED.  Admitted for acute possible biliary pancreatitis.  [facility] consulted.   Assessment & Plan:  Principal Problem:   Acute pancreatitis   Acute biliary pancreatitis -Lipase 452 > 118. -CT abdomen with contrast 12/7: Retroperitoneal edema with edema/inflammation around the pancreatic tail and second and third portions of the duodenum.  Imaging features could be compatible with acute pancreatitis.  No pancreatic necrosis or main pancreatic ductal dilation.  Possible trace pericholecystic fluid without calcified gallstones. -RUQ ultrasound: Cholelithiasis with gallbladder wall thickening but no associated Murphy sign or pericholecystic fluid. -Abnormal LFTs: AST 339 > 139, ALT 533 > 320, total bilirubin 6.7 > 6.8. -TGs: 25.  No history of alcohol use.  LDH 274 > 197. -Treated supportively with bowel rest, aggressive IV fluids and pain management. -[facility] follow-up appreciated.  Recommending surgical consultation for cholecystectomy and IOC during surgery to evaluate for choledocholithiasis and may be MRCP.  Symptomatic cholelithiasis - Reportedly has been having biliary symptoms for almost a year. - Now admitted for suspected acute biliary pancreatitis. -Management as above.  Abnormal LFTs -Possibly due to choledocholithiasis. -Management as  above.  Essential hypertension - Appears that he was on only as needed Inderal 10 mg daily and no maintenance meds.   - Initiated amlodipine 5 Mg daily.  As needed IV hydralazine.  Monitor.  GERD -PPI.  Body mass index is 31 kg/m.    DVT prophylaxis: SCDs Start: 07/18/21 2324     Code Status: Full Code Family Communication: None at bedside Disposition:  Status is: Inpatient  Remains inpatient appropriate because: Acute pancreatitis requiring IV fluids, bowel rest, evaluation for suspected biliary etiology and intervention for same        Consultants:   [facility] General surgery  Procedures:   None  Antimicrobials:   None   Subjective:  Seen this morning in the ED.  Feels better.  Reports that abdominal pain resolved overnight.  Concerned about dark urine.  Last BM 12/7.  No nausea or vomiting  Objective:   Vitals:   07/19/21 0730 07/19/21 0830 07/19/21 0930 07/19/21 1030  BP: 139/89 (!) 145/89 (!) 172/105 (!) 158/99  Pulse: 81 81 79 83  Resp: 16 16 16 16   Temp:      TempSrc:      SpO2: 98% 96% 95% 96%  Weight:      Height:        General exam: Young male, moderately built and nourished lying comfortably propped up in bed without distress.  Scleral icterus++.  Oral mucosa moist. Respiratory system: Clear to auscultation. Respiratory effort normal. Cardiovascular system: S1 & S2 heard, RRR. No JVD, murmurs, rubs, gallops or clicks. No pedal edema.  Not on telemetry. Gastrointestinal system: Abdomen soft.  Mild epigastric tenderness without guarding.  No rigidity or rebound.  No organomegaly  or masses appreciated.  Normal bowel sounds heard. Central nervous system: Alert and oriented. No focal neurological deficits. Extremities: Symmetric 5 x 5 power. Skin: No rashes, lesions or ulcers Psychiatry: Judgement and insight appear normal. Mood & affect appropriate.     Data Reviewed:   I have personally reviewed following labs and imaging  studies   CBC: Recent Labs  Lab 07/18/21 1057 07/19/21 0351  WBC 7.4 9.7  HGB 16.0 13.9  HCT 48.6 41.4  MCV 92.2 92.8  PLT 183 161    Basic Metabolic Panel: Recent Labs  Lab 07/18/21 1057 07/19/21 0351  NA 136 135  K 3.9 3.7  CL 101 104  CO2 28 23  GLUCOSE 110* 92  BUN 12 15  CREATININE 1.16 1.12  CALCIUM 9.0 8.4*    Liver Function Tests: Recent Labs  Lab 07/18/21 1057 07/19/21 0351  AST 339* 139*  ALT 533* 320*  ALKPHOS 159* 129*  BILITOT 6.7* 6.8*  PROT 7.7 6.7  ALBUMIN 4.1 3.4*    CBG: No results for input(s): GLUCAP in the last 168 hours.  Microbiology Studies:   Recent Results (from the past 240 hour(s))  Resp Panel by RT-PCR (Flu A&B, Covid) Nasopharyngeal Swab     Status: None   Collection Time: 07/18/21 12:08 PM   Specimen: Nasopharyngeal Swab; Nasopharyngeal(NP) swabs in vial transport medium  Result Value Ref Range Status   SARS Coronavirus 2 by RT PCR NEGATIVE NEGATIVE Final    Comment: (NOTE) SARS-CoV-2 target nucleic acids are NOT DETECTED.  The SARS-CoV-2 RNA is generally detectable in upper respiratory specimens during the acute phase of infection. The lowest concentration of SARS-CoV-2 viral copies this assay can detect is 138 copies/mL. A negative result does not preclude SARS-Cov-2 infection and should not be used as the sole basis for treatment or other patient management decisions. A negative result may occur with  improper specimen collection/handling, submission of specimen other than nasopharyngeal swab, presence of viral mutation(s) within the areas targeted by this assay, and inadequate number of viral copies(<138 copies/mL). A negative result must be combined with clinical observations, patient history, and epidemiological information. The expected result is Negative.  Fact Sheet for Patients:  BloggerCourse.com  Fact Sheet for Healthcare Providers:   SeriousBroker.it  This test is no t yet approved or cleared by the [place] FDA and  has been authorized for detection and/or diagnosis of SARS-CoV-2 by FDA under an Emergency Use Authorization (EUA). This EUA will remain  in effect (meaning this test can be used) for the duration of the COVID-19 declaration under Section 564(b)(1) of the Act, 21 U.S.C.section 360bbb-3(b)(1), unless the authorization is terminated  or revoked sooner.       Influenza A by PCR NEGATIVE NEGATIVE Final   Influenza B by PCR NEGATIVE NEGATIVE Final    Comment: (NOTE) The Xpert Xpress SARS-CoV-2/FLU/RSV plus assay is intended as an aid in the diagnosis of influenza from Nasopharyngeal swab specimens and should not be used as a sole basis for treatment. Nasal washings and aspirates are unacceptable for Xpert Xpress SARS-CoV-2/FLU/RSV testing.  Fact Sheet for Patients: BloggerCourse.com  Fact Sheet for Healthcare Providers: SeriousBroker.it  This test is not yet approved or cleared by the [place] FDA and has been authorized for detection and/or diagnosis of SARS-CoV-2 by FDA under an Emergency Use Authorization (EUA). This EUA will remain in effect (meaning this test can be used) for the duration of the COVID-19 declaration under Section 564(b)(1) of the Act, 21 U.S.C.  section 360bbb-3(b)(1), unless the authorization is terminated or revoked.  Performed at [facility], [address] [place], [place] [address]     Radiology Studies:  CT ABDOMEN PELVIS W CONTRAST  Result Date: 07/18/2021 CLINICAL DATA:  Pancreatitis suspected.  Abdominal pain EXAM: CT ABDOMEN AND PELVIS WITH CONTRAST TECHNIQUE: Multidetector CT imaging of the abdomen and pelvis was performed using the standard protocol following bolus administration of intravenous contrast. CONTRAST:  1mL OMNIPAQUE IOHEXOL 350 MG/ML SOLN  COMPARISON:  Outside exam dated 07/18/2017 FINDINGS: Lower chest: Unremarkable. Hepatobiliary: No suspicious focal abnormality within the liver parenchyma. Possible trace pericholecystic fluid. No evidence for calcified gallstones. No intrahepatic or extrahepatic biliary dilation. Pancreas: No main pancreatic duct dilatation. Pancreatic tail appears edematous with peripancreatic edema. Pancreatic parenchyma enhances throughout. Spleen: No splenomegaly. No focal mass lesion. Adrenals/Urinary Tract: No adrenal nodule or mass. Kidneys unremarkable. No evidence for hydroureter. The urinary bladder appears normal for the degree of distention. Stomach/Bowel: Moderate distention of the stomach with fluid. There is edema around the second and third portions of the duodenum. No small bowel wall thickening. No small bowel dilatation. The terminal ileum is normal. The appendix is normal. No gross colonic mass. No colonic wall thickening. Vascular/Lymphatic: No abdominal aortic aneurysm There is no gastrohepatic or hepatoduodenal ligament lymphadenopathy. No retroperitoneal or mesenteric lymphadenopathy. No pelvic sidewall lymphadenopathy. Reproductive: The prostate gland and seminal vesicles are unremarkable. Other: Retroperitoneal edema in the abdomen tracks down towards the pelvis. Trace perihepatic fluid evident. Musculoskeletal: No worrisome lytic or sclerotic osseous abnormality. Degenerative changes noted at the L5-S1 disc. IMPRESSION: 1. Retroperitoneal edema with edema/inflammation around the pancreatic tail and second and third portions of the duodenum. Imaging features could be compatible with acute pancreatitis. No evidence for pancreatic necrosis. No main pancreatic duct dilatation. Duodenitis would also be a consideration. 2. Possible trace pericholecystic fluid. No evidence for calcified gallstones. If there is clinical concern for acute cholecystitis, abdominal ultrasound may prove helpful to further evaluate.  3. Trace perihepatic fluid. Electronically Signed   By: Kennith Center M.D.   On: 07/18/2021 13:35   US Abdomen Limited RUQ (LIVER/GB)  Result Date: 07/18/2021 CLINICAL DATA:  Acute pancreatitis, elevated LFTs EXAM: ULTRASOUND ABDOMEN LIMITED RIGHT UPPER QUADRANT COMPARISON:  07/18/2021 FINDINGS: Gallbladder: Multiple echogenic shadowing gallstones noted, largest measuring 1.7 cm. Proximal gallbladder wall is thickened to 4.6 mm anteriorly. Despite this, no sonographic Murphy's sign elicited or pericholecystic fluid. Common bile duct: Diameter: 3 mm. Liver: No focal lesion identified. Within normal limits in parenchymal echogenicity. Portal vein is patent on color Doppler imaging with normal direction of blood flow towards the liver. Other: Trace perihepatic free fluid. IMPRESSION: Cholelithiasis with gallbladder wall thickening but no associated Murphy's sign or pericholecystic fluid. Findings remain nonspecific. Trace perihepatic ascites Electronically Signed   By: Judie Petit.  Shick M.D.   On: 07/18/2021 14:42    Scheduled Meds:    pantoprazole (PROTONIX) IV  40 mg Intravenous Q24H    Continuous Infusions:    sodium chloride 125 mL/hr at 07/18/21 2355     LOS: 1 day     Marcellus Scott, MD,  FACP, [facility], [facility], [facility] (Care Management Physician Certified) [facility] [place]  To contact the attending provider between 7A-7P or the covering provider during after hours 7P-7A, please log into the web site [email or web address] and access using universal [place] password for that web site. If you do not have the password, please call the hospital operator.  07/19/2021, 10:49  AM

## 2021-07-19 NOTE — Consult Note (Signed)
Jason Dougherty 10-25-1970  [id number].    Requesting MD: Dr. Waymon Amato Chief Complaint/Reason for Consult: Gallstone Pancreatitis  HPI: Jason Dougherty is a 50 y.o. male with a hx of GERD and HTN who presented for abdominal pain.  Patient reports on Tuesday morning at 5:30 AM he awoke from his sleep with severe epigastric abdominal pain that radiated to his back.  Patient report similar intermittent epigastric abdominal pain for the last several months that usually self resolve after over-the-counter medications.  Patient reports this attack however persisted for greater than 24 hours which prompted him to go to the [place] where blood work was obtained that showed elevated LFTs. He was instructed to present to the ED for evaluation.  Patient reports that his symptoms are not worsened by p.o. intake.  No specific triggers, they usually occur late at night or first thing in the morning.  He notes occasional associated nausea but no emesis.  Denies fevers or diarrhea.  No prior history of pancreatitis.  He was unaware that he had gallstones. He denies any alcohol use.   Workup thus far: Afebrile WBC 7.4 >> 9.7 Lipase 452 >> 118 AST 339 >> 139  ALT 533 >> 320 T. Bili 6.7 >> 6.8 CT w/ findings of acute pancreatitis without abscess or necrosis. RUQ [place] w/ cholelithiasis without pericholecystitis fluid or Murphy's sign. There was proximal GB wall thickening to 4.77mm.    ROS: Review of Systems  Constitutional:  Negative for chills and fever.  Respiratory:  Negative for cough and shortness of breath.   Cardiovascular:  Negative for chest pain.  Gastrointestinal:  Positive for abdominal pain, constipation and nausea. Negative for diarrhea.  Musculoskeletal:  Positive for back pain.  Psychiatric/Behavioral:  Negative for substance abuse.   All other systems reviewed and are negative.  Family History  Problem Relation Age of Onset   Diabetes Mother    Hypertension Sister    Diabetes Maternal  Grandmother     Past Medical History:  Diagnosis Date   Allergic rhinitis    Arthritis    Bone spur     Past Surgical History:  Procedure Laterality Date   BACK SURGERY     HAND SURGERY      Social History:  reports that he has never smoked. He has never used smokeless tobacco. He reports that he does not drink alcohol and does not use drugs. Patient is retired from [employer] in which he served 20+ years. He lives alone. He denies alcohol or tobacco use  Allergies:  Allergies  Allergen Reactions   Duloxetine Other (See Comments)    Nosebleed, cognitive changes   Mirabegron Other (See Comments)    Medications Prior to Admission  Medication Sig Dispense Refill   diltiazem 2 % GEL 1 application daily as needed (for fissures).     esomeprazole (NEXIUM) 40 MG capsule Take 40 mg by mouth daily as needed (for acid reflux).     fexofenadine (ALLEGRA) 180 MG tablet Take 180 mg by mouth daily as needed for allergies.     PRESCRIPTION MEDICATION Inhale into the lungs See admin instructions. CPAP- at bedtime     propranolol (INDERAL) 10 MG tablet Take 10 mg by mouth daily as needed (for headache/when feels like BP may be high).     sildenafil (VIAGRA) 100 MG tablet Take 100 mg by mouth as needed for erectile dysfunction.       Physical Exam: Blood pressure 129/73, pulse 85, temperature  99.2 F (37.3 C), temperature source Oral, resp. rate 17, height 5' 6.5" (1.689 m), weight 88.5 kg, SpO2 98 %. General: pleasant, WD/WN AA male who is laying in bed in NAD HEENT: head is normocephalic, atraumatic.  Sclera are noninjected.  PERRL.  Ears and nose without any masses or lesions.  Mouth is pink and moist. Dentition fair Heart: regular, rate, and rhythm.  Normal s1,s2. No obvious murmurs, gallops, or rubs noted.  Palpable pedal pulses bilaterally  Lungs: CTAB, no wheezes, rhonchi, or rales noted.  Respiratory effort nonlabored Abd:  Soft, ND, epigastric tenderness without peritonitis.  Negative Murphy's sign. +BS, no masses, hernias, or organomegaly MS: no BUE/BLE edema, calves soft and nontender Skin: warm and dry with no masses, lesions, or rashes Psych: A&Ox4 with an appropriate affect Neuro: cranial nerves grossly intact, equal strength in BUE/BLE bilaterally, normal speech, thought process intact, moves all extremities, gait not assessed   Results for orders placed or performed during the hospital encounter of 07/18/21 (from the past 48 hour(s))  Lipase, blood     Status: Abnormal   Collection Time: 07/18/21 10:57 AM  Result Value Ref Range   Lipase 452 (H) 11 - 51 U/L    Comment: RESULTS CONFIRMED BY MANUAL DILUTION Performed at [facility], [address], [place], [place] [address]   Comprehensive metabolic panel     Status: Abnormal   Collection Time: 07/18/21 10:57 AM  Result Value Ref Range   Sodium 136 135 - 145 mmol/L   Potassium 3.9 3.5 - 5.1 mmol/L   Chloride 101 98 - 111 mmol/L   CO2 28 22 - 32 mmol/L   Glucose, Bld 110 (H) 70 - 99 mg/dL    Comment: Glucose reference range applies only to samples taken after fasting for at least 8 hours.   BUN 12 6 - 20 mg/dL   Creatinine, Ser 6.04 0.61 - 1.24 mg/dL   Calcium 9.0 8.9 - 54.0 mg/dL   Total Protein 7.7 6.5 - 8.1 g/dL   Albumin 4.1 3.5 - 5.0 g/dL   AST 981 (H) 15 - 41 U/L   ALT 533 (H) 0 - 44 U/L   Alkaline Phosphatase 159 (H) 38 - 126 U/L   Total Bilirubin 6.7 (H) 0.3 - 1.2 mg/dL   GFR, Estimated >19 >14 mL/min    Comment: (NOTE) Calculated using the CKD-EPI Creatinine Equation (2021)    Anion gap 7 5 - 15    Comment: Performed at [facility], [address], [place], [place] [address]  CBC     Status: None   Collection Time: 07/18/21 10:57 AM  Result Value Ref Range   WBC 7.4 4.0 - 10.5 K/uL   RBC 5.27 4.22 - 5.81 MIL/uL   Hemoglobin 16.0 13.0 - 17.0 g/dL   HCT 62.1 30.8 - 65.7 %   MCV 92.2 80.0 - 100.0 fL   MCH 30.4 26.0 - 34.0 pg   MCHC 32.9  30.0 - 36.0 g/dL   RDW 84.6 96.2 - 95.2 %   Platelets 183 150 - 400 K/uL   nRBC 0.0 0.0 - 0.2 %    Comment: Performed at [facility], [address], [place], [place] [address]  Resp Panel by RT-PCR (Flu A&B, Covid) Nasopharyngeal Swab     Status: None   Collection Time: 07/18/21 12:08 PM   Specimen: Nasopharyngeal Swab; Nasopharyngeal(NP) swabs in vial transport medium  Result Value Ref Range   SARS Coronavirus 2 by  RT PCR NEGATIVE NEGATIVE    Comment: (NOTE) SARS-CoV-2 target nucleic acids are NOT DETECTED.  The SARS-CoV-2 RNA is generally detectable in upper respiratory specimens during the acute phase of infection. The lowest concentration of SARS-CoV-2 viral copies this assay can detect is 138 copies/mL. A negative result does not preclude SARS-Cov-2 infection and should not be used as the sole basis for treatment or other patient management decisions. A negative result may occur with  improper specimen collection/handling, submission of specimen other than nasopharyngeal swab, presence of viral mutation(s) within the areas targeted by this assay, and inadequate number of viral copies(<138 copies/mL). A negative result must be combined with clinical observations, patient history, and epidemiological information. The expected result is Negative.  Fact Sheet for Patients:  BloggerCourse.com  Fact Sheet for Healthcare Providers:  SeriousBroker.it  This test is no t yet approved or cleared by the [place] FDA and  has been authorized for detection and/or diagnosis of SARS-CoV-2 by FDA under an Emergency Use Authorization (EUA). This EUA will remain  in effect (meaning this test can be used) for the duration of the COVID-19 declaration under Section 564(b)(1) of the Act, 21 U.S.C.section 360bbb-3(b)(1), unless the authorization is terminated  or revoked sooner.       Influenza A by PCR NEGATIVE  NEGATIVE   Influenza B by PCR NEGATIVE NEGATIVE    Comment: (NOTE) The Xpert Xpress SARS-CoV-2/FLU/RSV plus assay is intended as an aid in the diagnosis of influenza from Nasopharyngeal swab specimens and should not be used as a sole basis for treatment. Nasal washings and aspirates are unacceptable for Xpert Xpress SARS-CoV-2/FLU/RSV testing.  Fact Sheet for Patients: BloggerCourse.com  Fact Sheet for Healthcare Providers: SeriousBroker.it  This test is not yet approved or cleared by the [place] FDA and has been authorized for detection and/or diagnosis of SARS-CoV-2 by FDA under an Emergency Use Authorization (EUA). This EUA will remain in effect (meaning this test can be used) for the duration of the COVID-19 declaration under Section 564(b)(1) of the Act, 21 U.S.C. section 360bbb-3(b)(1), unless the authorization is terminated or revoked.  Performed at [facility], [address], [place], [place] [address]   Lipid panel     Status: None   Collection Time: 07/18/21  3:21 PM  Result Value Ref Range   Cholesterol 152 0 - 200 mg/dL   Triglycerides 25 <010 mg/dL   HDL 61 >93 mg/dL   Total CHOL/HDL Ratio 2.5 RATIO   VLDL 5 0 - 40 mg/dL   LDL Cholesterol 86 0 - 99 mg/dL    Comment:        Total Cholesterol/HDL:CHD Risk Coronary Heart Disease Risk Table                     Men   Women  1/2 Average Risk   3.4   3.3  Average Risk       5.0   4.4  2 X Average Risk   9.6   7.1  3 X Average Risk  23.4   11.0        Use the calculated Patient Ratio above and the CHD Risk Table to determine the patient's CHD Risk.        ATP III CLASSIFICATION (LDL):  <100     mg/dL   Optimal  235-573  mg/dL   Near or Above  Optimal  130-159  mg/dL   Borderline  161-096  mg/dL   High  >045     mg/dL   Very High Performed at [facility], [address] [place], [place]  [address]   Hepatitis panel, acute     Status: None   Collection Time: 07/18/21  3:44 PM  Result Value Ref Range   Hepatitis B Surface Ag NON REACTIVE NON REACTIVE   HCV Ab NON REACTIVE NON REACTIVE    Comment: (NOTE) Nonreactive HCV antibody screen is consistent with no HCV infections,  unless recent infection is suspected or other evidence exists to indicate HCV infection.     Hep A IgM NON REACTIVE NON REACTIVE   Hep B C IgM NON REACTIVE NON REACTIVE    Comment: Performed at [facility], [address] [place], [place] [address]  Lactate dehydrogenase     Status: Abnormal   Collection Time: 07/18/21  3:44 PM  Result Value Ref Range   LDH 274 (H) 98 - 192 U/L    Comment: Performed at [facility], [address] [place], [place] [address]  Urinalysis, Routine w reflex microscopic     Status: Abnormal   Collection Time: 07/18/21  3:50 PM  Result Value Ref Range   Color, Urine AMBER (A) YELLOW    Comment: BIOCHEMICALS MAY BE AFFECTED BY COLOR   APPearance CLEAR CLEAR   Specific Gravity, Urine <=1.005 1.005 - 1.030   pH 6.5 5.0 - 8.0   Glucose, UA NEGATIVE NEGATIVE mg/dL   Hgb urine dipstick NEGATIVE NEGATIVE   Bilirubin Urine MODERATE (A) NEGATIVE   Ketones, ur TRACE (A) NEGATIVE mg/dL   Protein, ur NEGATIVE NEGATIVE mg/dL   Nitrite NEGATIVE NEGATIVE   Leukocytes,Ua NEGATIVE NEGATIVE   Bacteria, UA NONE SEEN NONE SEEN   Mucus PRESENT     Comment: Performed at [facility], [address] [place], [place] [address]  HIV Antibody (routine testing w rflx)     Status: None   Collection Time: 07/18/21 11:48 PM  Result Value Ref Range   HIV Screen 4th Generation wRfx Non Reactive Non Reactive    Comment: Performed at [facility], [address], [place], [place] [address]  Comprehensive metabolic panel     Status: Abnormal   Collection Time: 07/19/21  3:51 AM  Result Value Ref Range   Sodium 135 135 - 145 mmol/L   Potassium  3.7 3.5 - 5.1 mmol/L   Chloride 104 98 - 111 mmol/L   CO2 23 22 - 32 mmol/L   Glucose, Bld 92 70 - 99 mg/dL    Comment: Glucose reference range applies only to samples taken after fasting for at least 8 hours.   BUN 15 6 - 20 mg/dL   Creatinine, Ser 6.96 0.61 - 1.24 mg/dL   Calcium 8.4 (L) 8.9 - 10.3 mg/dL   Total Protein 6.7 6.5 - 8.1 g/dL   Albumin 3.4 (L) 3.5 - 5.0 g/dL   AST 295 (H) 15 - 41 U/L   ALT 320 (H) 0 - 44 U/L   Alkaline Phosphatase 129 (H) 38 - 126 U/L   Total Bilirubin 6.8 (H) 0.3 - 1.2 mg/dL   GFR, Estimated >28 >41 mL/min    Comment: (NOTE) Calculated using the CKD-EPI Creatinine Equation (2021)    Anion gap 8 5 - 15    Comment: Performed at [facility], [address], [place], [place] [address]  CBC     Status: None   Collection Time: 07/19/21  3:51 AM  Result Value Ref Range   WBC 9.7 4.0 - 10.5 K/uL   RBC 4.46 4.22 - 5.81 MIL/uL   Hemoglobin 13.9 13.0 - 17.0 g/dL   HCT 82.9 56.2 - 13.0 %   MCV 92.8 80.0 - 100.0 fL   MCH 31.2 26.0 - 34.0 pg   MCHC 33.6 30.0 - 36.0 g/dL   RDW 86.5 78.4 - 69.6 %   Platelets 161 150 - 400 K/uL   nRBC 0.0 0.0 - 0.2 %    Comment: Performed at [facility], [address] [place], [place] [address]  Lactate dehydrogenase     Status: Abnormal   Collection Time: 07/19/21  3:51 AM  Result Value Ref Range   LDH 197 (H) 98 - 192 U/L    Comment: Performed at [facility], [address], [place], [place] [address]  Lipase, blood     Status: Abnormal   Collection Time: 07/19/21  3:51 AM  Result Value Ref Range   Lipase 118 (H) 11 - 51 U/L    Comment: Performed at [facility], [address] [place], [place] [address]   CT ABDOMEN PELVIS W CONTRAST  Result Date: 07/18/2021 CLINICAL DATA:  Pancreatitis suspected.  Abdominal pain EXAM: CT ABDOMEN AND PELVIS WITH CONTRAST TECHNIQUE: Multidetector CT imaging of the abdomen and pelvis was performed using  the standard protocol following bolus administration of intravenous contrast. CONTRAST:  75mL OMNIPAQUE IOHEXOL 350 MG/ML SOLN COMPARISON:  Outside exam dated 07/18/2017 FINDINGS: Lower chest: Unremarkable. Hepatobiliary: No suspicious focal abnormality within the liver parenchyma. Possible trace pericholecystic fluid. No evidence for calcified gallstones. No intrahepatic or extrahepatic biliary dilation. Pancreas: No main pancreatic duct dilatation. Pancreatic tail appears edematous with peripancreatic edema. Pancreatic parenchyma enhances throughout. Spleen: No splenomegaly. No focal mass lesion. Adrenals/Urinary Tract: No adrenal nodule or mass. Kidneys unremarkable. No evidence for hydroureter. The urinary bladder appears normal for the degree of distention. Stomach/Bowel: Moderate distention of the stomach with fluid. There is edema around the second and third portions of the duodenum. No small bowel wall thickening. No small bowel dilatation. The terminal ileum is normal. The appendix is normal. No gross colonic mass. No colonic wall thickening. Vascular/Lymphatic: No abdominal aortic aneurysm There is no gastrohepatic or hepatoduodenal ligament lymphadenopathy. No retroperitoneal or mesenteric lymphadenopathy. No pelvic sidewall lymphadenopathy. Reproductive: The prostate gland and seminal vesicles are unremarkable. Other: Retroperitoneal edema in the abdomen tracks down towards the pelvis. Trace perihepatic fluid evident. Musculoskeletal: No worrisome lytic or sclerotic osseous abnormality. Degenerative changes noted at the L5-S1 disc. IMPRESSION: 1. Retroperitoneal edema with edema/inflammation around the pancreatic tail and second and third portions of the duodenum. Imaging features could be compatible with acute pancreatitis. No evidence for pancreatic necrosis. No main pancreatic duct dilatation. Duodenitis would also be a consideration. 2. Possible trace pericholecystic fluid. No evidence for calcified  gallstones. If there is clinical concern for acute cholecystitis, abdominal ultrasound may prove helpful to further evaluate. 3. Trace perihepatic fluid. Electronically Signed   By: Kennith Center M.D.   On: 07/18/2021 13:35   US Abdomen Limited RUQ (LIVER/GB)  Result Date: 07/18/2021 CLINICAL DATA:  Acute pancreatitis, elevated LFTs EXAM: ULTRASOUND ABDOMEN LIMITED RIGHT UPPER QUADRANT COMPARISON:  07/18/2021 FINDINGS: Gallbladder: Multiple echogenic shadowing gallstones noted, largest measuring 1.7 cm. Proximal gallbladder wall is thickened to 4.6 mm anteriorly. Despite this, no sonographic Murphy's sign elicited or  pericholecystic fluid. Common bile duct: Diameter: 3 mm. Liver: No focal lesion identified. Within normal limits in parenchymal echogenicity. Portal vein is patent on color Doppler imaging with normal direction of blood flow towards the liver. Other: Trace perihepatic free fluid. IMPRESSION: Cholelithiasis with gallbladder wall thickening but no associated Murphy's sign or pericholecystic fluid. Findings remain nonspecific. Trace perihepatic ascites Electronically Signed   By: Judie Petit.  Shick M.D.   On: 07/18/2021 14:42    Anti-infectives (From admission, onward)    None       Assessment/Plan Gallstone Pancreatitis Elevated LFT's - Await results of MRCP. High suspicion for choledocholithiasis given T bili of 6.8.  - Timing of lap chole pending results of MRCP (to determine if patient will need ERCP first), as well as the improvement of his pancreatitis -Continue to trend labs -We will follow with you  FEN - NPO, IVF VTE - SCDs, okay for chemical prophylaxis from a general surgery standpoint ID - None currently. Afebrile, wbc wnl.   Jason Dougherty, [facility] Surgery 07/19/2021, 2:10 PM Please see Amion for pager number during day hours 7:00am-4:30pm

## 2021-07-19 NOTE — Progress Notes (Signed)
Patient does not want to wear cpap at this time. CPAP in room if he wants it later.

## 2021-07-19 NOTE — Plan of Care (Signed)

## 2021-07-19 NOTE — Progress Notes (Addendum)
[facility] Gastroenterology Progress Note  Jason Dougherty 50 y.o. November 29, 1970  CC:  Acute Pancreatitis   Subjective: Patient states his pain has improved, though still present. Had a bowel movement yesterday. Still having dark urine. Reports bloating is still present as well. Denies melena/hematochezia. Denies nausea/vomiting  ROS : Review of Systems  Gastrointestinal:  Positive for abdominal pain. Negative for blood in stool, constipation, diarrhea, heartburn, melena, nausea and vomiting.  Genitourinary:  Negative for dysuria and urgency.     Objective: Vital signs in last 24 hours: Vitals:   07/19/21 0830 07/19/21 0930  BP: (!) 145/89 (!) 172/105  Pulse: 81 79  Resp: 16 16  Temp:    SpO2: 96% 95%    Physical Exam:  General:  Alert, cooperative, no distress, appears stated age  Head:  Normocephalic, without obvious abnormality, atraumatic  Eyes:  Sclera icteric, EOM's intact,  Lungs:   Clear to auscultation bilaterally, respirations unlabored  Heart:  Regular rate and rhythm, S1, S2 normal  Abdomen:   Soft, TTP epigastrium, bowel sounds active all four quadrants,  no masses,   Extremities: Extremities normal, atraumatic, no  edema  Pulses: 2+ and symmetric    Lab Results: Recent Labs    07/18/21 1057 07/19/21 0351  NA 136 135  K 3.9 3.7  CL 101 104  CO2 28 23  GLUCOSE 110* 92  BUN 12 15  CREATININE 1.16 1.12  CALCIUM 9.0 8.4*   Recent Labs    07/18/21 1057 07/19/21 0351  AST 339* 139*  ALT 533* 320*  ALKPHOS 159* 129*  BILITOT 6.7* 6.8*  PROT 7.7 6.7  ALBUMIN 4.1 3.4*   Recent Labs    07/18/21 1057 07/19/21 0351  WBC 7.4 9.7  HGB 16.0 13.9  HCT 48.6 41.4  MCV 92.2 92.8  PLT 183 161   No results for input(s): LABPROT, INR in the last 72 hours.    Assessment Acute Pancreatitis:likely secondary to gallstones - CT ab/pelvis with contrast 12/7: significant edema around pancreatic tail and 2nd/3rd portions of duodenum. No pancreatic duct dilation.  Pericholecystic fluid, no gallstones. Possible acute cholecystitis. - No leukocytosis - AST 139/ ALT 320/ Alk Phos 129 (trending down) - T. Bili: 6.8 - normal renal function - lipase 452 - [place] 12/7: cholelithiasis with gallbladder wall thickening but no murphys sign.   Dark urine - likely secondary to hyperbilirubinemia.   Plan: Could consider surgical consult for cholecystectomy and IOC during surgery to evaluate for choledocholithiasis. Will obtain MRCP for evaluation of choledocholithiasis LFTs trending down, bilirubin remains the same. Continue IVF and supportive care [facility] will follow  Garnette Scheuermann PA-C 07/19/2021, 10:02 AM  Contact #  [phone]

## 2021-07-20 DIAGNOSIS — R7989 Other specified abnormal findings of blood chemistry: Secondary | ICD-10-CM

## 2021-07-20 LAB — COMPREHENSIVE METABOLIC PANEL
ALT: 187 U/L — ABNORMAL HIGH (ref 0–44)
AST: 64 U/L — ABNORMAL HIGH (ref 15–41)
Albumin: 3.1 g/dL — ABNORMAL LOW (ref 3.5–5.0)
Alkaline Phosphatase: 118 U/L (ref 38–126)
Anion gap: 8 (ref 5–15)
BUN: 13 mg/dL (ref 6–20)
CO2: 23 mmol/L (ref 22–32)
Calcium: 8.1 mg/dL — ABNORMAL LOW (ref 8.9–10.3)
Chloride: 105 mmol/L (ref 98–111)
Creatinine, Ser: 1.16 mg/dL (ref 0.61–1.24)
GFR, Estimated: >60 mL/min (ref >60)
Glucose, Bld: 80 mg/dL (ref 70–99)
Potassium: 3.9 mmol/L (ref 3.5–5.1)
Sodium: 136 mmol/L (ref 135–145)
Total Bilirubin: 4.7 mg/dL — ABNORMAL HIGH (ref 0.3–1.2)
Total Protein: 6.4 g/dL — ABNORMAL LOW (ref 6.5–8.1)

## 2021-07-20 LAB — CBC
HCT: 37.5 % — ABNORMAL LOW (ref 39.0–52.0)
Hemoglobin: 12.4 g/dL — ABNORMAL LOW (ref 13.0–17.0)
MCH: 30.5 pg (ref 26.0–34.0)
MCHC: 33.1 g/dL (ref 30.0–36.0)
MCV: 92.1 fL (ref 80.0–100.0)
Platelets: 143 10*3/uL — ABNORMAL LOW (ref 150–400)
RBC: 4.07 MIL/uL — ABNORMAL LOW (ref 4.22–5.81)
RDW: 13 % (ref 11.5–15.5)
WBC: 8.5 10*3/uL (ref 4.0–10.5)
nRBC: 0 % (ref 0.0–0.2)

## 2021-07-20 LAB — LIPASE, BLOOD: Lipase: 47 U/L (ref 11–51)

## 2021-07-20 LAB — ANA W/REFLEX IF POSITIVE: Anti Nuclear Antibody (ANA): NEGATIVE

## 2021-07-20 MED ORDER — SODIUM CHLORIDE 0.9 % IV SOLN: INTRAVENOUS | Status: AC

## 2021-07-20 MED ORDER — ACYCLOVIR 5 % EX OINT
TOPICAL_OINTMENT | CUTANEOUS | Status: DC
  Administered 2021-07-21 – 2021-07-23 (×3): 1 via TOPICAL
  Filled 2021-07-20: qty 15

## 2021-07-20 MED ORDER — POLYVINYL ALCOHOL 1.4 % OP SOLN
1.0000 [drp] | OPHTHALMIC | Status: DC | PRN
  Administered 2021-07-20: 10:00:00 1 [drp] via OPHTHALMIC
  Filled 2021-07-20: qty 15

## 2021-07-20 NOTE — Progress Notes (Signed)
[facility] Progress Note     Subjective: CC:  Denies significant pain - unable to tell me if his abdominal pain is better, worse, or the same compared to yesterday. Denies nausea or vomiting. Reports flatus. States his last BM was Wednesday in the ED.  Denies urinary sxs but states his urine is still dark.   Objective: Vital signs in last 24 hours: Temp:  [99.2 F (37.3 C)-99.6 F (37.6 C)] 99.6 F (37.6 C) (12/09 0516) Pulse Rate:  [85-92] 86 (12/09 0516) Resp:  [16-17] 17 (12/09 0516) BP: (129-161)/(73-97) 132/79 (12/09 0516) SpO2:  [93 %-99 %] 94 % (12/09 0516) Last BM Date: 07/19/21  Intake/Output from previous day: 12/08 0701 - 12/09 0700 In: 3048.2 [I.V.:3048.2] Out: -  Intake/Output this shift: Total I/O In: 627.3 [I.V.:627.3] Out: -   PE: Gen:  Alert, NAD, pleasant Card:  Regular rate and rhythm, pedal pulses 2+ BL Pulm:  Normal effort, clear to auscultation bilaterally Abd: Soft, mild tenderness to deep palpation of epigastric region, non-distended, bowel sounds present in all 4 quadrants, no HSM Skin: warm and dry, no rashes  Psych: A&Ox3   Lab Results:  Recent Labs    07/19/21 0351 07/20/21 0758  WBC 9.7 8.5  HGB 13.9 12.4*  HCT 41.4 37.5*  PLT 161 143*   BMET Recent Labs    07/19/21 0351 07/20/21 0758  NA 135 136  K 3.7 3.9  CL 104 105  CO2 23 23  GLUCOSE 92 80  BUN 15 13  CREATININE 1.12 1.16  CALCIUM 8.4* 8.1*   PT/INR No results for input(s): LABPROT, INR in the last 72 hours. CMP     Component Value Date/Time   NA 136 07/20/2021 0758   K 3.9 07/20/2021 0758   CL 105 07/20/2021 0758   CO2 23 07/20/2021 0758   GLUCOSE 80 07/20/2021 0758   BUN 13 07/20/2021 0758   CREATININE 1.16 07/20/2021 0758   CALCIUM 8.1 (L) 07/20/2021 0758   PROT 6.4 (L) 07/20/2021 0758   ALBUMIN 3.1 (L) 07/20/2021 0758   AST 64 (H) 07/20/2021 0758   ALT 187 (H) 07/20/2021 0758   ALKPHOS 118 07/20/2021 0758   BILITOT 4.7 (H) 07/20/2021  0758   GFRNONAA >60 07/20/2021 0758   Lipase     Component Value Date/Time   LIPASE 47 07/20/2021 0758       Studies/Results: CT ABDOMEN PELVIS W CONTRAST  Result Date: 07/18/2021 CLINICAL DATA:  Pancreatitis suspected.  Abdominal pain EXAM: CT ABDOMEN AND PELVIS WITH CONTRAST TECHNIQUE: Multidetector CT imaging of the abdomen and pelvis was performed using the standard protocol following bolus administration of intravenous contrast. CONTRAST:  75mL OMNIPAQUE IOHEXOL 350 MG/ML SOLN COMPARISON:  Outside exam dated 07/18/2017 FINDINGS: Lower chest: Unremarkable. Hepatobiliary: No suspicious focal abnormality within the liver parenchyma. Possible trace pericholecystic fluid. No evidence for calcified gallstones. No intrahepatic or extrahepatic biliary dilation. Pancreas: No main pancreatic duct dilatation. Pancreatic tail appears edematous with peripancreatic edema. Pancreatic parenchyma enhances throughout. Spleen: No splenomegaly. No focal mass lesion. Adrenals/Urinary Tract: No adrenal nodule or mass. Kidneys unremarkable. No evidence for hydroureter. The urinary bladder appears normal for the degree of distention. Stomach/Bowel: Moderate distention of the stomach with fluid. There is edema around the second and third portions of the duodenum. No small bowel wall thickening. No small bowel dilatation. The terminal ileum is normal. The appendix is normal. No gross colonic mass. No colonic wall thickening. Vascular/Lymphatic: No abdominal aortic aneurysm There is no gastrohepatic  or hepatoduodenal ligament lymphadenopathy. No retroperitoneal or mesenteric lymphadenopathy. No pelvic sidewall lymphadenopathy. Reproductive: The prostate gland and seminal vesicles are unremarkable. Other: Retroperitoneal edema in the abdomen tracks down towards the pelvis. Trace perihepatic fluid evident. Musculoskeletal: No worrisome lytic or sclerotic osseous abnormality. Degenerative changes noted at the L5-S1 disc.  IMPRESSION: 1. Retroperitoneal edema with edema/inflammation around the pancreatic tail and second and third portions of the duodenum. Imaging features could be compatible with acute pancreatitis. No evidence for pancreatic necrosis. No main pancreatic duct dilatation. Duodenitis would also be a consideration. 2. Possible trace pericholecystic fluid. No evidence for calcified gallstones. If there is clinical concern for acute cholecystitis, abdominal ultrasound may prove helpful to further evaluate. 3. Trace perihepatic fluid. Electronically Signed   By: Kennith Center M.D.   On: 07/18/2021 13:35   MR 3D Recon At Scanner  Result Date: 07/19/2021 CLINICAL DATA:  Elevated liver function tests in a 50 year old male with suspected pancreatitis. EXAM: MRI ABDOMEN WITHOUT AND WITH CONTRAST (INCLUDING MRCP) TECHNIQUE: Multiplanar multisequence MR imaging of the abdomen was performed both before and after the administration of intravenous contrast. Heavily T2-weighted images of the biliary and pancreatic ducts were obtained, and three-dimensional MRCP images were rendered by post processing. CONTRAST:  35mL GADAVIST GADOBUTROL 1 MMOL/ML IV SOLN COMPARISON:  Comparison is made with CT evaluation from July 18, 2021. FINDINGS: Lower chest: Small bilateral pleural effusions and basilar atelectasis, not well assessed. The effusions are new since previous CT imaging. Is Hepatobiliary: Signs of extensive sludge in the gallbladder lumen. Large gallstone in the gallbladder fundus. This measures 11-12 mm. Gallbladder is nondistended. There is mild hyperenhancement about the fundus of the gallbladder adjacent to the large gallstone which is circumferential and uniform. The common bile duct is normal caliber. Normal biliary anatomy. No filling defect in the common bile duct. Trace pericholecystic fluid but generalized edema and small volume ascites is present in the abdomen. Liver contour is smooth. No signs of suspicious focal  hepatic lesion. Ascites with slightly increased volume since previous imaging, seen about the liver on the prior study but suggested in the lower abdomen and upper pelvis on the current exam. Pancreas: Peripancreatic edema. Suspect small developing acute pancreatic collection about the tail of the pancreas along the lesser curvature of the stomach measuring approximately 2.5 cm greatest dimension. T1 signal in the pancreatic parenchyma is preserved. Enhancement is present throughout the gland. Splenic vein is patent. No pancreatic ductal dilation. Extensive stranding about the pancreas and generalized stranding in the anterior pararenal space. Spleen:  Normal spleen. Adrenals/Urinary Tract: Adrenal glands are normal. Symmetric renal enhancement. No hydronephrosis. No perinephric stranding. Stomach/Bowel: Mild edema of the small bowel in the area of the duodenum. No acute gastrointestinal process to the extent evaluated on abdominal MR otherwise. Vascular/Lymphatic: No pathologically enlarged lymph nodes identified. No abdominal aortic aneurysm demonstrated. Other: Ascites slightly increased in volume now in the abdomen and pelvis, extending in the low pelvis previously isolated to perihepatic changes. Musculoskeletal: No suspicious bone lesions identified. IMPRESSION: Signs of acute pancreatitis with small developing acute pancreatic collection about the tail of the pancreas along the lesser curvature of the stomach, along with generalized edema in the retroperitoneum and slight increase in abdominal ascites. No signs of choledocholithiasis. Gallbladder wall with mild thickening and mild hyperenhancement but without distension. Sludge and stones in the gallbladder lumen. Findings could be secondary inflammation from generalized inflammation related to pancreatitis. If there is continued concern for cholecystitis HIDA scan could potentially add specificity.  Small bilateral pleural effusions and basilar atelectasis,  new since previous CT imaging. Electronically Signed   By: Donzetta Kohut M.D.   On: 07/19/2021 19:06   MR ABDOMEN MRCP W WO CONTAST  Result Date: 07/19/2021 CLINICAL DATA:  Elevated liver function tests in a 50 year old male with suspected pancreatitis. EXAM: MRI ABDOMEN WITHOUT AND WITH CONTRAST (INCLUDING MRCP) TECHNIQUE: Multiplanar multisequence MR imaging of the abdomen was performed both before and after the administration of intravenous contrast. Heavily T2-weighted images of the biliary and pancreatic ducts were obtained, and three-dimensional MRCP images were rendered by post processing. CONTRAST:  21mL GADAVIST GADOBUTROL 1 MMOL/ML IV SOLN COMPARISON:  Comparison is made with CT evaluation from July 18, 2021. FINDINGS: Lower chest: Small bilateral pleural effusions and basilar atelectasis, not well assessed. The effusions are new since previous CT imaging. Is Hepatobiliary: Signs of extensive sludge in the gallbladder lumen. Large gallstone in the gallbladder fundus. This measures 11-12 mm. Gallbladder is nondistended. There is mild hyperenhancement about the fundus of the gallbladder adjacent to the large gallstone which is circumferential and uniform. The common bile duct is normal caliber. Normal biliary anatomy. No filling defect in the common bile duct. Trace pericholecystic fluid but generalized edema and small volume ascites is present in the abdomen. Liver contour is smooth. No signs of suspicious focal hepatic lesion. Ascites with slightly increased volume since previous imaging, seen about the liver on the prior study but suggested in the lower abdomen and upper pelvis on the current exam. Pancreas: Peripancreatic edema. Suspect small developing acute pancreatic collection about the tail of the pancreas along the lesser curvature of the stomach measuring approximately 2.5 cm greatest dimension. T1 signal in the pancreatic parenchyma is preserved. Enhancement is present throughout the  gland. Splenic vein is patent. No pancreatic ductal dilation. Extensive stranding about the pancreas and generalized stranding in the anterior pararenal space. Spleen:  Normal spleen. Adrenals/Urinary Tract: Adrenal glands are normal. Symmetric renal enhancement. No hydronephrosis. No perinephric stranding. Stomach/Bowel: Mild edema of the small bowel in the area of the duodenum. No acute gastrointestinal process to the extent evaluated on abdominal MR otherwise. Vascular/Lymphatic: No pathologically enlarged lymph nodes identified. No abdominal aortic aneurysm demonstrated. Other: Ascites slightly increased in volume now in the abdomen and pelvis, extending in the low pelvis previously isolated to perihepatic changes. Musculoskeletal: No suspicious bone lesions identified. IMPRESSION: Signs of acute pancreatitis with small developing acute pancreatic collection about the tail of the pancreas along the lesser curvature of the stomach, along with generalized edema in the retroperitoneum and slight increase in abdominal ascites. No signs of choledocholithiasis. Gallbladder wall with mild thickening and mild hyperenhancement but without distension. Sludge and stones in the gallbladder lumen. Findings could be secondary inflammation from generalized inflammation related to pancreatitis. If there is continued concern for cholecystitis HIDA scan could potentially add specificity. Small bilateral pleural effusions and basilar atelectasis, new since previous CT imaging. Electronically Signed   By: Donzetta Kohut M.D.   On: 07/19/2021 19:06   US Abdomen Limited RUQ (LIVER/GB)  Result Date: 07/18/2021 CLINICAL DATA:  Acute pancreatitis, elevated LFTs EXAM: ULTRASOUND ABDOMEN LIMITED RIGHT UPPER QUADRANT COMPARISON:  07/18/2021 FINDINGS: Gallbladder: Multiple echogenic shadowing gallstones noted, largest measuring 1.7 cm. Proximal gallbladder wall is thickened to 4.6 mm anteriorly. Despite this, no sonographic Murphy's  sign elicited or pericholecystic fluid. Common bile duct: Diameter: 3 mm. Liver: No focal lesion identified. Within normal limits in parenchymal echogenicity. Portal vein is patent on color Doppler imaging with  normal direction of blood flow towards the liver. Other: Trace perihepatic free fluid. IMPRESSION: Cholelithiasis with gallbladder wall thickening but no associated Murphy's sign or pericholecystic fluid. Findings remain nonspecific. Trace perihepatic ascites Electronically Signed   By: Judie Petit.  Shick M.D.   On: 07/18/2021 14:42    Anti-infectives: Anti-infectives (From admission, onward)    None      Assessment/Plan  Gallstone Pancreatitis Elevated LFT's, hyperbilirubinemia  - MRCP without choledocholithiasis, there is 2.5 cm fluid collection of the pancreatic tail near the lesser curve of the stomach. - LFTs down-trending, bili is 4.7 from 6.8, repeat CMP in AM; lipase has normalized (47) - I do think patient would benefit from lap chole this admission, timing of surgery will be discussed with MD.  -Continue to trend labs -We will follow with you   FEN - NPO, IVF VTE - SCDs, okay for chemical prophylaxis from a general surgery standpoint ID - None currently. Afebrile, wbc wnl.    LOS: 2 days    Hosie Spangle, [facility] Please see Amion for pager number during day hours 7:00am-4:30pm

## 2021-07-20 NOTE — Plan of Care (Signed)
  Problem: Pain Managment: Goal: General experience of comfort will improve Outcome: Progressing   Problem: Safety: Goal: Ability to remain free from injury will improve Outcome: Progressing   

## 2021-07-20 NOTE — Progress Notes (Signed)
[facility] Progress Note  Jason Dougherty 50 y.o. 1970-11-09  CC:  Acute pancreatitis   Subjective: Patient states pain is no different than yesterday. Still present. Reports bloating. Denies nausea/vomiting. Denies melena/hematochezia.  ROS : Review of Systems  Gastrointestinal:  Positive for abdominal pain. Negative for blood in stool, constipation, diarrhea, heartburn, melena, nausea and vomiting.  Genitourinary:  Negative for dysuria and urgency.     Objective: Vital signs in last 24 hours: Vitals:   07/19/21 2130 07/20/21 0516  BP: 131/75 132/79  Pulse: 92 86  Resp: 17 17  Temp: 99.2 F (37.3 C) 99.6 F (37.6 C)  SpO2: 93% 94%    Physical Exam:  General:  Alert, cooperative, no distress, appears stated age  Head:  Normocephalic, without obvious abnormality, atraumatic  Eyes:  Anicteric sclera, EOM's intact  Lungs:   Clear to auscultation bilaterally, respirations unlabored  Heart:  Regular rate and rhythm, S1, S2 normal  Abdomen:   Soft, TTP epigastrium, bowel sounds active all four quadrants,  no masses,     Lab Results: Recent Labs    07/19/21 0351 07/20/21 0758  NA 135 136  K 3.7 3.9  CL 104 105  CO2 23 23  GLUCOSE 92 80  BUN 15 13  CREATININE 1.12 1.16  CALCIUM 8.4* 8.1*   Recent Labs    07/19/21 0351 07/20/21 0758  AST 139* 64*  ALT 320* 187*  ALKPHOS 129* 118  BILITOT 6.8* 4.7*  PROT 6.7 6.4*  ALBUMIN 3.4* 3.1*   Recent Labs    07/19/21 0351 07/20/21 0758  WBC 9.7 8.5  HGB 13.9 12.4*  HCT 41.4 37.5*  MCV 92.8 92.1  PLT 161 143*   No results for input(s): LABPROT, INR in the last 72 hours.    Assessment Acute Pancreatitis:likely secondary to gallstones - CT ab/pelvis with contrast 12/7: significant edema around pancreatic tail and 2nd/3rd portions of duodenum. No pancreatic duct dilation. Pericholecystic fluid, no gallstones. Possible acute cholecystitis. - No leukocytosis - AST 64/ ALT 187/ Alk Phos 118 (trending  down) - T. Bili: 4.7 (6.8 yesterday) - normal renal function - lipase 452 - [place] 12/7: cholelithiasis with gallbladder wall thickening but no murphys sign. - MRCP 12/9: No choledocholithiasis, acute pancreatitis with small developing acute pancreatic collection around tail of pancreas and generalized edema in the retroperitoneum.  Gallbladder wall with thickening.  Sludge and stones noted.  Possibly secondary inflammation from pancreatitis.   Dark urine - likely secondary to hyperbilirubinemia.   Plan: MRCP negative for choledocholithiasis.  No need for ERCP. There does show small fluid collection, if becomes organized can consider IR consultation.   Recommend surgical consultation for possible cholecystectomy plus IOC LFTs are trending down. Continue IVF and supportive care [facility] will follow   Garnette Scheuermann PA-C 07/20/2021, 11:07 AM  Contact #  [phone]

## 2021-07-20 NOTE — Plan of Care (Signed)
  Problem: Education: Goal: Knowledge of General Education information will improve Description: Including pain rating scale, medication(s)/side effects and non-pharmacologic comfort measures Outcome: Progressing   Problem: Activity: Goal: Risk for activity intolerance will decrease Outcome: Progressing   Problem: Pain Managment: Goal: General experience of comfort will improve Outcome: Progressing   

## 2021-07-20 NOTE — Progress Notes (Addendum)
PROGRESS NOTE   Jason Dougherty  UYE:[id number]    DOB: Aug 28, 1970    DOA: 07/18/2021  PCP: [facility]   I have briefly reviewed patients previous medical records in [facility].  Chief Complaint  Patient presents with   Abdominal Pain    Brief Narrative:  50 year old male with medical history significant for GERD, hypertension, history of intermittent abdominal pain for almost a year, presented to the ED with intense epigastric abdominal pain that started on 12/6 morning, unrelieved by a BM and some pain meds, associated with nausea but no vomiting, went to the [place] where he was noted to have elevated LFTs and hence sent to the ED.  Admitted for acute biliary pancreatitis.  [facility] consulted.  S/p RUQ ultrasound and MRCP.  May need HIDA scan.  General surgery on board as well and are considering cholecystectomy this admission, timing yet to be determined.   Assessment & Plan:  Principal Problem:   Acute pancreatitis   Acute biliary pancreatitis -Lipase 452 > 118. -CT abdomen with contrast 12/7: Retroperitoneal edema with edema/inflammation around the pancreatic tail and second and third portions of the duodenum.  Imaging features could be compatible with acute pancreatitis.  No pancreatic necrosis or main pancreatic ductal dilation.  Possible trace pericholecystic fluid without calcified gallstones. -RUQ ultrasound: Cholelithiasis with gallbladder wall thickening but no associated Murphy sign or pericholecystic fluid. -MRCP: Signs of acute pancreatitis with developing small acute pancreatic collection about the tail of the pancreas.  Generalized edema in the retroperitoneum and slight increase in abdominal ascites.  No choledocholithiasis.  Gallbladder wall mild thickening and hyperenhancement with sludge and stones in the lumen. -Abnormal LFTs: AST 339 > 139, ALT 533 > 320, total bilirubin 6.7 > 6.8.  LFTs today are still pending. -TGs: 25.  No history of alcohol use.  LDH  274 > 197. -Treated supportively with bowel rest, aggressive IV fluids and pain management. -[facility] and general surgery input appreciated, general surgery considering cholecystectomy this admission, timing to be determined.  Symptomatic cholelithiasis - Reportedly has been having biliary symptoms for almost a year. - Now admitted for suspected acute biliary pancreatitis. -Evaluation and management as noted above.  Will eventually need cholecystectomy.  Abnormal LFTs -Possibly due to choledocholithiasis. -Management as above. -LFTs from today are still pending.  Essential hypertension - Appears that he was on only as needed Inderal 10 mg daily and no maintenance meds.   - Initiated amlodipine 5 Mg daily.  As needed IV hydralazine.  Monitor. -Much better controlled now.  GERD -PPI.  Thrombocytopenia - Mild and probably related to acute illness.  Follow CBC.  Cold sore on lip -Started acyclovir ointment.  Body mass index is 31 kg/m.    DVT prophylaxis: SCDs Start: 07/18/21 2324     Code Status: Full Code Family Communication: None at bedside Disposition:  Status is: Inpatient  Remains inpatient appropriate because: Acute pancreatitis requiring IV fluids, bowel rest, evaluation for suspected biliary etiology and intervention for same        Consultants:   [facility] General surgery  Procedures:   None  Antimicrobials:   None   Subjective:  Reports cold sore on right side of upper lip and dry eyes, asking for meds for same.  Not much abdominal pain.  Also reports unsteady on walking, advised him to not walk without supervision or assistance with nursing.  Continues to complain of dark urine.  No itching.  Objective:   Vitals:  07/19/21 1230 07/19/21 1403 07/19/21 2130 07/20/21 0516  BP: (!) 161/97 129/73 131/75 132/79  Pulse: 87 85 92 86  Resp: 16 17 17 17   Temp:  99.2 F (37.3 C) 99.2 F (37.3 C) 99.6 F (37.6 C)  TempSrc:  Oral    SpO2: 99% 98%  93% 94%  Weight:      Height:        General exam: Young male, moderately built and nourished seen walking back from bathroom to bed.  Patient's RN and I were in the room and supervised him back to the bed.  Scleral icterus++.  Oral mucosa moist. Respiratory system: Clear to auscultation.  No increased work of breathing. Cardiovascular system: S1 & S2 heard, RRR. No JVD, murmurs, rubs, gallops or clicks. No pedal edema.  Not on telemetry. Gastrointestinal system: Abdomen is soft, nondistended, nontender.  Normal bowel sounds heard. Central nervous system: Alert and oriented. No focal neurological deficits. Extremities: Symmetric 5 x 5 power. Skin: No rashes, lesions or ulcers.  Has a tiny cold sore on right side of his upper lip. Psychiatry: Judgement and insight appears to be poor.  Flat.  Mood & affect appropriate.     Data Reviewed:   I have personally reviewed following labs and imaging studies   CBC: Recent Labs  Lab 07/18/21 1057 07/19/21 0351 07/20/21 0758  WBC 7.4 9.7 8.5  HGB 16.0 13.9 12.4*  HCT 48.6 41.4 37.5*  MCV 92.2 92.8 92.1  PLT 183 161 143*    Basic Metabolic Panel: Recent Labs  Lab 07/18/21 1057 07/19/21 0351  NA 136 135  K 3.9 3.7  CL 101 104  CO2 28 23  GLUCOSE 110* 92  BUN 12 15  CREATININE 1.16 1.12  CALCIUM 9.0 8.4*    Liver Function Tests: Recent Labs  Lab 07/18/21 1057 07/19/21 0351  AST 339* 139*  ALT 533* 320*  ALKPHOS 159* 129*  BILITOT 6.7* 6.8*  PROT 7.7 6.7  ALBUMIN 4.1 3.4*    CBG: No results for input(s): GLUCAP in the last 168 hours.  Microbiology Studies:   Recent Results (from the past 240 hour(s))  Resp Panel by RT-PCR (Flu A&B, Covid) Nasopharyngeal Swab     Status: None   Collection Time: 07/18/21 12:08 PM   Specimen: Nasopharyngeal Swab; Nasopharyngeal(NP) swabs in vial transport medium  Result Value Ref Range Status   SARS Coronavirus 2 by RT PCR NEGATIVE NEGATIVE Final    Comment: (NOTE) SARS-CoV-2  target nucleic acids are NOT DETECTED.  The SARS-CoV-2 RNA is generally detectable in upper respiratory specimens during the acute phase of infection. The lowest concentration of SARS-CoV-2 viral copies this assay can detect is 138 copies/mL. A negative result does not preclude SARS-Cov-2 infection and should not be used as the sole basis for treatment or other patient management decisions. A negative result may occur with  improper specimen collection/handling, submission of specimen other than nasopharyngeal swab, presence of viral mutation(s) within the areas targeted by this assay, and inadequate number of viral copies(<138 copies/mL). A negative result must be combined with clinical observations, patient history, and epidemiological information. The expected result is Negative.  Fact Sheet for Patients:  14/07/22  Fact Sheet for Healthcare Providers:  BloggerCourse.com  This test is no t yet approved or cleared by the [place] FDA and  has been authorized for detection and/or diagnosis of SARS-CoV-2 by FDA under an Emergency Use Authorization (EUA). This EUA will remain  in effect (meaning this test can  be used) for the duration of the COVID-19 declaration under Section 564(b)(1) of the Act, 21 U.S.C.section 360bbb-3(b)(1), unless the authorization is terminated  or revoked sooner.       Influenza A by PCR NEGATIVE NEGATIVE Final   Influenza B by PCR NEGATIVE NEGATIVE Final    Comment: (NOTE) The Xpert Xpress SARS-CoV-2/FLU/RSV plus assay is intended as an aid in the diagnosis of influenza from Nasopharyngeal swab specimens and should not be used as a sole basis for treatment. Nasal washings and aspirates are unacceptable for Xpert Xpress SARS-CoV-2/FLU/RSV testing.  Fact Sheet for Patients: BloggerCourse.com  Fact Sheet for Healthcare  Providers: SeriousBroker.it  This test is not yet approved or cleared by the [place] FDA and has been authorized for detection and/or diagnosis of SARS-CoV-2 by FDA under an Emergency Use Authorization (EUA). This EUA will remain in effect (meaning this test can be used) for the duration of the COVID-19 declaration under Section 564(b)(1) of the Act, 21 U.S.C. section 360bbb-3(b)(1), unless the authorization is terminated or revoked.  Performed at [facility] : [facility], [address], [place], [place] [address]     Radiology Studies:  CT ABDOMEN PELVIS W CONTRAST  Result Date: 07/18/2021 CLINICAL DATA:  Pancreatitis suspected.  Abdominal pain EXAM: CT ABDOMEN AND PELVIS WITH CONTRAST TECHNIQUE: Multidetector CT imaging of the abdomen and pelvis was performed using the standard protocol following bolus administration of intravenous contrast. CONTRAST:  75mL OMNIPAQUE IOHEXOL 350 MG/ML SOLN COMPARISON:  Outside exam dated 07/18/2017 FINDINGS: Lower chest: Unremarkable. Hepatobiliary: No suspicious focal abnormality within the liver parenchyma. Possible trace pericholecystic fluid. No evidence for calcified gallstones. No intrahepatic or extrahepatic biliary dilation. Pancreas: No main pancreatic duct dilatation. Pancreatic tail appears edematous with peripancreatic edema. Pancreatic parenchyma enhances throughout. Spleen: No splenomegaly. No focal mass lesion. Adrenals/Urinary Tract: No adrenal nodule or mass. Kidneys unremarkable. No evidence for hydroureter. The urinary bladder appears normal for the degree of distention. Stomach/Bowel: Moderate distention of the stomach with fluid. There is edema around the second and third portions of the duodenum. No small bowel wall thickening. No small bowel dilatation. The terminal ileum is normal. The appendix is normal. No gross colonic mass. No colonic wall thickening. Vascular/Lymphatic: No abdominal aortic  aneurysm There is no gastrohepatic or hepatoduodenal ligament lymphadenopathy. No retroperitoneal or mesenteric lymphadenopathy. No pelvic sidewall lymphadenopathy. Reproductive: The prostate gland and seminal vesicles are unremarkable. Other: Retroperitoneal edema in the abdomen tracks down towards the pelvis. Trace perihepatic fluid evident. Musculoskeletal: No worrisome lytic or sclerotic osseous abnormality. Degenerative changes noted at the L5-S1 disc. IMPRESSION: 1. Retroperitoneal edema with edema/inflammation around the pancreatic tail and second and third portions of the duodenum. Imaging features could be compatible with acute pancreatitis. No evidence for pancreatic necrosis. No main pancreatic duct dilatation. Duodenitis would also be a consideration. 2. Possible trace pericholecystic fluid. No evidence for calcified gallstones. If there is clinical concern for acute cholecystitis, abdominal ultrasound may prove helpful to further evaluate. 3. Trace perihepatic fluid. Electronically Signed   By: Kennith Center M.D.   On: 07/18/2021 13:35   MR 3D Recon At Scanner  Result Date: 07/19/2021 CLINICAL DATA:  Elevated liver function tests in a 50 year old male with suspected pancreatitis. EXAM: MRI ABDOMEN WITHOUT AND WITH CONTRAST (INCLUDING MRCP) TECHNIQUE: Multiplanar multisequence MR imaging of the abdomen was performed both before and after the administration of intravenous contrast. Heavily T2-weighted images of the biliary and pancreatic ducts were obtained, and three-dimensional MRCP images were rendered by post processing.  CONTRAST:  51mL GADAVIST GADOBUTROL 1 MMOL/ML IV SOLN COMPARISON:  Comparison is made with CT evaluation from July 18, 2021. FINDINGS: Lower chest: Small bilateral pleural effusions and basilar atelectasis, not well assessed. The effusions are new since previous CT imaging. Is Hepatobiliary: Signs of extensive sludge in the gallbladder lumen. Large gallstone in the gallbladder  fundus. This measures 11-12 mm. Gallbladder is nondistended. There is mild hyperenhancement about the fundus of the gallbladder adjacent to the large gallstone which is circumferential and uniform. The common bile duct is normal caliber. Normal biliary anatomy. No filling defect in the common bile duct. Trace pericholecystic fluid but generalized edema and small volume ascites is present in the abdomen. Liver contour is smooth. No signs of suspicious focal hepatic lesion. Ascites with slightly increased volume since previous imaging, seen about the liver on the prior study but suggested in the lower abdomen and upper pelvis on the current exam. Pancreas: Peripancreatic edema. Suspect small developing acute pancreatic collection about the tail of the pancreas along the lesser curvature of the stomach measuring approximately 2.5 cm greatest dimension. T1 signal in the pancreatic parenchyma is preserved. Enhancement is present throughout the gland. Splenic vein is patent. No pancreatic ductal dilation. Extensive stranding about the pancreas and generalized stranding in the anterior pararenal space. Spleen:  Normal spleen. Adrenals/Urinary Tract: Adrenal glands are normal. Symmetric renal enhancement. No hydronephrosis. No perinephric stranding. Stomach/Bowel: Mild edema of the small bowel in the area of the duodenum. No acute gastrointestinal process to the extent evaluated on abdominal MR otherwise. Vascular/Lymphatic: No pathologically enlarged lymph nodes identified. No abdominal aortic aneurysm demonstrated. Other: Ascites slightly increased in volume now in the abdomen and pelvis, extending in the low pelvis previously isolated to perihepatic changes. Musculoskeletal: No suspicious bone lesions identified. IMPRESSION: Signs of acute pancreatitis with small developing acute pancreatic collection about the tail of the pancreas along the lesser curvature of the stomach, along with generalized edema in the  retroperitoneum and slight increase in abdominal ascites. No signs of choledocholithiasis. Gallbladder wall with mild thickening and mild hyperenhancement but without distension. Sludge and stones in the gallbladder lumen. Findings could be secondary inflammation from generalized inflammation related to pancreatitis. If there is continued concern for cholecystitis HIDA scan could potentially add specificity. Small bilateral pleural effusions and basilar atelectasis, new since previous CT imaging. Electronically Signed   By: Donzetta Kohut M.D.   On: 07/19/2021 19:06   MR ABDOMEN MRCP W WO CONTAST  Result Date: 07/19/2021 CLINICAL DATA:  Elevated liver function tests in a 50 year old male with suspected pancreatitis. EXAM: MRI ABDOMEN WITHOUT AND WITH CONTRAST (INCLUDING MRCP) TECHNIQUE: Multiplanar multisequence MR imaging of the abdomen was performed both before and after the administration of intravenous contrast. Heavily T2-weighted images of the biliary and pancreatic ducts were obtained, and three-dimensional MRCP images were rendered by post processing. CONTRAST:  10mL GADAVIST GADOBUTROL 1 MMOL/ML IV SOLN COMPARISON:  Comparison is made with CT evaluation from July 18, 2021. FINDINGS: Lower chest: Small bilateral pleural effusions and basilar atelectasis, not well assessed. The effusions are new since previous CT imaging. Is Hepatobiliary: Signs of extensive sludge in the gallbladder lumen. Large gallstone in the gallbladder fundus. This measures 11-12 mm. Gallbladder is nondistended. There is mild hyperenhancement about the fundus of the gallbladder adjacent to the large gallstone which is circumferential and uniform. The common bile duct is normal caliber. Normal biliary anatomy. No filling defect in the common bile duct. Trace pericholecystic fluid but generalized edema and small  volume ascites is present in the abdomen. Liver contour is smooth. No signs of suspicious focal hepatic lesion. Ascites  with slightly increased volume since previous imaging, seen about the liver on the prior study but suggested in the lower abdomen and upper pelvis on the current exam. Pancreas: Peripancreatic edema. Suspect small developing acute pancreatic collection about the tail of the pancreas along the lesser curvature of the stomach measuring approximately 2.5 cm greatest dimension. T1 signal in the pancreatic parenchyma is preserved. Enhancement is present throughout the gland. Splenic vein is patent. No pancreatic ductal dilation. Extensive stranding about the pancreas and generalized stranding in the anterior pararenal space. Spleen:  Normal spleen. Adrenals/Urinary Tract: Adrenal glands are normal. Symmetric renal enhancement. No hydronephrosis. No perinephric stranding. Stomach/Bowel: Mild edema of the small bowel in the area of the duodenum. No acute gastrointestinal process to the extent evaluated on abdominal MR otherwise. Vascular/Lymphatic: No pathologically enlarged lymph nodes identified. No abdominal aortic aneurysm demonstrated. Other: Ascites slightly increased in volume now in the abdomen and pelvis, extending in the low pelvis previously isolated to perihepatic changes. Musculoskeletal: No suspicious bone lesions identified. IMPRESSION: Signs of acute pancreatitis with small developing acute pancreatic collection about the tail of the pancreas along the lesser curvature of the stomach, along with generalized edema in the retroperitoneum and slight increase in abdominal ascites. No signs of choledocholithiasis. Gallbladder wall with mild thickening and mild hyperenhancement but without distension. Sludge and stones in the gallbladder lumen. Findings could be secondary inflammation from generalized inflammation related to pancreatitis. If there is continued concern for cholecystitis HIDA scan could potentially add specificity. Small bilateral pleural effusions and basilar atelectasis, new since previous CT  imaging. Electronically Signed   By: Donzetta Kohut M.D.   On: 07/19/2021 19:06   US Abdomen Limited RUQ (LIVER/GB)  Result Date: 07/18/2021 CLINICAL DATA:  Acute pancreatitis, elevated LFTs EXAM: ULTRASOUND ABDOMEN LIMITED RIGHT UPPER QUADRANT COMPARISON:  07/18/2021 FINDINGS: Gallbladder: Multiple echogenic shadowing gallstones noted, largest measuring 1.7 cm. Proximal gallbladder wall is thickened to 4.6 mm anteriorly. Despite this, no sonographic Murphy's sign elicited or pericholecystic fluid. Common bile duct: Diameter: 3 mm. Liver: No focal lesion identified. Within normal limits in parenchymal echogenicity. Portal vein is patent on color Doppler imaging with normal direction of blood flow towards the liver. Other: Trace perihepatic free fluid. IMPRESSION: Cholelithiasis with gallbladder wall thickening but no associated Murphy's sign or pericholecystic fluid. Findings remain nonspecific. Trace perihepatic ascites Electronically Signed   By: Judie Petit.  Shick M.D.   On: 07/18/2021 14:42    Scheduled Meds:    amLODipine  5 mg Oral Daily   pantoprazole (PROTONIX) IV  40 mg Intravenous Q24H    Continuous Infusions:    sodium chloride 150 mL/hr at 07/20/21 0142     LOS: 2 days     Marcellus Scott, MD,  FACP, [facility], [facility], [facility] (Care Management Physician Certified) [facility] [place]  To contact the attending provider between 7A-7P or the covering provider during after hours 7P-7A, please log into the web site [email or web address] and access using universal [place] password for that web site. If you do not have the password, please call the hospital operator.  07/20/2021, 9:22 AM

## 2021-07-21 LAB — CBC
HCT: 34.8 % — ABNORMAL LOW (ref 39.0–52.0)
Hemoglobin: 11.6 g/dL — ABNORMAL LOW (ref 13.0–17.0)
MCH: 30.7 pg (ref 26.0–34.0)
MCHC: 33.3 g/dL (ref 30.0–36.0)
MCV: 92.1 fL (ref 80.0–100.0)
Platelets: 149 10*3/uL — ABNORMAL LOW (ref 150–400)
RBC: 3.78 MIL/uL — ABNORMAL LOW (ref 4.22–5.81)
RDW: 12.8 % (ref 11.5–15.5)
WBC: 7.3 10*3/uL (ref 4.0–10.5)
nRBC: 0 % (ref 0.0–0.2)

## 2021-07-21 LAB — COMPREHENSIVE METABOLIC PANEL
ALT: 137 U/L — ABNORMAL HIGH (ref 0–44)
AST: 54 U/L — ABNORMAL HIGH (ref 15–41)
Albumin: 2.8 g/dL — ABNORMAL LOW (ref 3.5–5.0)
Alkaline Phosphatase: 105 U/L (ref 38–126)
Anion gap: 7 (ref 5–15)
BUN: 8 mg/dL (ref 6–20)
CO2: 26 mmol/L (ref 22–32)
Calcium: 8.1 mg/dL — ABNORMAL LOW (ref 8.9–10.3)
Chloride: 105 mmol/L (ref 98–111)
Creatinine, Ser: 1.06 mg/dL (ref 0.61–1.24)
GFR, Estimated: >60 mL/min (ref >60)
Glucose, Bld: 97 mg/dL (ref 70–99)
Potassium: 3.5 mmol/L (ref 3.5–5.1)
Sodium: 138 mmol/L (ref 135–145)
Total Bilirubin: 4.4 mg/dL — ABNORMAL HIGH (ref 0.3–1.2)
Total Protein: 6.2 g/dL — ABNORMAL LOW (ref 6.5–8.1)

## 2021-07-21 NOTE — Plan of Care (Signed)
  Problem: Safety: Goal: Ability to remain free from injury will improve Outcome: Progressing   Problem: Pain Managment: Goal: General experience of comfort will improve Outcome: Progressing   Problem: Elimination: Goal: Will not experience complications related to bowel motility Outcome: Progressing   

## 2021-07-21 NOTE — Plan of Care (Signed)
  Problem: Education: Goal: Knowledge of General Education information will improve Description: Including pain rating scale, medication(s)/side effects and non-pharmacologic comfort measures Outcome: Progressing   Problem: Activity: Goal: Risk for activity intolerance will decrease Outcome: Progressing   Problem: Pain Managment: Goal: General experience of comfort will improve Outcome: Progressing   

## 2021-07-21 NOTE — Progress Notes (Signed)
RT checked with patient about wearing CPAP. Patient stated that he may change his mind so machine remains in the room.

## 2021-07-21 NOTE — Progress Notes (Signed)
[facility] Progress Note  Jason Dougherty 50 y.o. 08-Dec-1970   Subjective: Feels ok. Tolerating full liquids. Denies abdominal pain.  Objective: Vital signs: Vitals:   07/20/21 1349 07/20/21 2109  BP: 140/81 139/87  Pulse: 84 80  Resp: 18 18  Temp: 99.5 F (37.5 C) 99.5 F (37.5 C)  SpO2: 95% 98%    Physical Exam: Gen: alert, no acute distress, well-nourished, pleasant HEENT: anicteric sclera CV: RRR Chest: CTA B Abd: soft, nontender, nondistended, +BS Ext: no edema  Lab Results: Recent Labs    07/20/21 0758 07/21/21 0341  NA 136 138  K 3.9 3.5  CL 105 105  CO2 23 26  GLUCOSE 80 97  BUN 13 8  CREATININE 1.16 1.06  CALCIUM 8.1* 8.1*   Recent Labs    07/20/21 0758 07/21/21 0341  AST 64* 54*  ALT 187* 137*  ALKPHOS 118 105  BILITOT 4.7* 4.4*  PROT 6.4* 6.2*  ALBUMIN 3.1* 2.8*   Recent Labs    07/20/21 0758 07/21/21 0341  WBC 8.5 7.3  HGB 12.4* 11.6*  HCT 37.5* 34.8*  MCV 92.1 92.1  PLT 143* 149*      Assessment/Plan: Gallstone pancreatitis - No choledocholithiasis on MRCP. LFTs improving. Clinically improving. Needs GB surgery this admission and hopefully he is agreeable to have it done tomorrow. Will sign off. Call us back if questions.   [name] 07/21/2021, 12:01 PM  Questions please call [phone] Patient ID: Jason Dougherty, male   DOB: Apr 22, 1971, 50 y.o.   MRN: [id number]

## 2021-07-21 NOTE — Progress Notes (Signed)
Subjective/Chief Complaint: He reports minimal abdominal pain this morning   Objective: Vital signs in last 24 hours: Temp:  [99.5 F (37.5 C)] 99.5 F (37.5 C) (12/09 2109) Pulse Rate:  [80-84] 80 (12/09 2109) Resp:  [18] 18 (12/09 2109) BP: (139-140)/(81-87) 139/87 (12/09 2109) SpO2:  [95 %-98 %] 98 % (12/09 2109) Last BM Date: 07/20/21  Intake/Output from previous day: 12/09 0701 - 12/10 0700 In: 1197.3 [I.V.:1197.3] Out: -  Intake/Output this shift: No intake/output data recorded.  Exam: Awake and alert Clear icterus still present Abdomen is soft and obese with mild epigastric guarding  Lab Results:  Recent Labs    07/20/21 0758 07/21/21 0341  WBC 8.5 7.3  HGB 12.4* 11.6*  HCT 37.5* 34.8*  PLT 143* 149*   BMET Recent Labs    07/20/21 0758 07/21/21 0341  NA 136 138  K 3.9 3.5  CL 105 105  CO2 23 26  GLUCOSE 80 97  BUN 13 8  CREATININE 1.16 1.06  CALCIUM 8.1* 8.1*   PT/INR No results for input(s): LABPROT, INR in the last 72 hours. ABG No results for input(s): PHART, HCO3 in the last 72 hours.  Invalid input(s): PCO2, PO2  Studies/Results: MR 3D Recon At Scanner  Result Date: 07/19/2021 CLINICAL DATA:  Elevated liver function tests in a 50 year old male with suspected pancreatitis. EXAM: MRI ABDOMEN WITHOUT AND WITH CONTRAST (INCLUDING MRCP) TECHNIQUE: Multiplanar multisequence MR imaging of the abdomen was performed both before and after the administration of intravenous contrast. Heavily T2-weighted images of the biliary and pancreatic ducts were obtained, and three-dimensional MRCP images were rendered by post processing. CONTRAST:  50mL GADAVIST GADOBUTROL 1 MMOL/ML IV SOLN COMPARISON:  Comparison is made with CT evaluation from July 18, 2021. FINDINGS: Lower chest: Small bilateral pleural effusions and basilar atelectasis, not well assessed. The effusions are new since previous CT imaging. Is Hepatobiliary: Signs of extensive sludge in the  gallbladder lumen. Large gallstone in the gallbladder fundus. This measures 11-12 mm. Gallbladder is nondistended. There is mild hyperenhancement about the fundus of the gallbladder adjacent to the large gallstone which is circumferential and uniform. The common bile duct is normal caliber. Normal biliary anatomy. No filling defect in the common bile duct. Trace pericholecystic fluid but generalized edema and small volume ascites is present in the abdomen. Liver contour is smooth. No signs of suspicious focal hepatic lesion. Ascites with slightly increased volume since previous imaging, seen about the liver on the prior study but suggested in the lower abdomen and upper pelvis on the current exam. Pancreas: Peripancreatic edema. Suspect small developing acute pancreatic collection about the tail of the pancreas along the lesser curvature of the stomach measuring approximately 2.5 cm greatest dimension. T1 signal in the pancreatic parenchyma is preserved. Enhancement is present throughout the gland. Splenic vein is patent. No pancreatic ductal dilation. Extensive stranding about the pancreas and generalized stranding in the anterior pararenal space. Spleen:  Normal spleen. Adrenals/Urinary Tract: Adrenal glands are normal. Symmetric renal enhancement. No hydronephrosis. No perinephric stranding. Stomach/Bowel: Mild edema of the small bowel in the area of the duodenum. No acute gastrointestinal process to the extent evaluated on abdominal MR otherwise. Vascular/Lymphatic: No pathologically enlarged lymph nodes identified. No abdominal aortic aneurysm demonstrated. Other: Ascites slightly increased in volume now in the abdomen and pelvis, extending in the low pelvis previously isolated to perihepatic changes. Musculoskeletal: No suspicious bone lesions identified. IMPRESSION: Signs of acute pancreatitis with small developing acute pancreatic collection about the tail of  the pancreas along the lesser curvature of the  stomach, along with generalized edema in the retroperitoneum and slight increase in abdominal ascites. No signs of choledocholithiasis. Gallbladder wall with mild thickening and mild hyperenhancement but without distension. Sludge and stones in the gallbladder lumen. Findings could be secondary inflammation from generalized inflammation related to pancreatitis. If there is continued concern for cholecystitis HIDA scan could potentially add specificity. Small bilateral pleural effusions and basilar atelectasis, new since previous CT imaging. Electronically Signed   By: Donzetta Kohut M.D.   On: 07/19/2021 19:06   MR ABDOMEN MRCP W WO CONTAST  Result Date: 07/19/2021 CLINICAL DATA:  Elevated liver function tests in a 50 year old male with suspected pancreatitis. EXAM: MRI ABDOMEN WITHOUT AND WITH CONTRAST (INCLUDING MRCP) TECHNIQUE: Multiplanar multisequence MR imaging of the abdomen was performed both before and after the administration of intravenous contrast. Heavily T2-weighted images of the biliary and pancreatic ducts were obtained, and three-dimensional MRCP images were rendered by post processing. CONTRAST:  30mL GADAVIST GADOBUTROL 1 MMOL/ML IV SOLN COMPARISON:  Comparison is made with CT evaluation from July 18, 2021. FINDINGS: Lower chest: Small bilateral pleural effusions and basilar atelectasis, not well assessed. The effusions are new since previous CT imaging. Is Hepatobiliary: Signs of extensive sludge in the gallbladder lumen. Large gallstone in the gallbladder fundus. This measures 11-12 mm. Gallbladder is nondistended. There is mild hyperenhancement about the fundus of the gallbladder adjacent to the large gallstone which is circumferential and uniform. The common bile duct is normal caliber. Normal biliary anatomy. No filling defect in the common bile duct. Trace pericholecystic fluid but generalized edema and small volume ascites is present in the abdomen. Liver contour is smooth. No signs  of suspicious focal hepatic lesion. Ascites with slightly increased volume since previous imaging, seen about the liver on the prior study but suggested in the lower abdomen and upper pelvis on the current exam. Pancreas: Peripancreatic edema. Suspect small developing acute pancreatic collection about the tail of the pancreas along the lesser curvature of the stomach measuring approximately 2.5 cm greatest dimension. T1 signal in the pancreatic parenchyma is preserved. Enhancement is present throughout the gland. Splenic vein is patent. No pancreatic ductal dilation. Extensive stranding about the pancreas and generalized stranding in the anterior pararenal space. Spleen:  Normal spleen. Adrenals/Urinary Tract: Adrenal glands are normal. Symmetric renal enhancement. No hydronephrosis. No perinephric stranding. Stomach/Bowel: Mild edema of the small bowel in the area of the duodenum. No acute gastrointestinal process to the extent evaluated on abdominal MR otherwise. Vascular/Lymphatic: No pathologically enlarged lymph nodes identified. No abdominal aortic aneurysm demonstrated. Other: Ascites slightly increased in volume now in the abdomen and pelvis, extending in the low pelvis previously isolated to perihepatic changes. Musculoskeletal: No suspicious bone lesions identified. IMPRESSION: Signs of acute pancreatitis with small developing acute pancreatic collection about the tail of the pancreas along the lesser curvature of the stomach, along with generalized edema in the retroperitoneum and slight increase in abdominal ascites. No signs of choledocholithiasis. Gallbladder wall with mild thickening and mild hyperenhancement but without distension. Sludge and stones in the gallbladder lumen. Findings could be secondary inflammation from generalized inflammation related to pancreatitis. If there is continued concern for cholecystitis HIDA scan could potentially add specificity. Small bilateral pleural effusions and  basilar atelectasis, new since previous CT imaging. Electronically Signed   By: Donzetta Kohut M.D.   On: 07/19/2021 19:06    Anti-infectives: Anti-infectives (From admission, onward)    None  Assessment/Plan: Gallstone Pancreatitis Elevated LFT's, hyperbilirubinemia   Bilirubin still elevated at 4.4 but is slightly less than yesterday.  Clinically, his pancreatitis has improved. I discussed potentially proceeding with a laparoscopic cholecystectomy cholangiogram tomorrow but he is uncertain if he wants to proceed with surgery and would like to wait and see what his labs look like in the morning which I feel is reasonable. I will let him have full liquid diet and will make him n.p.o. at midnight.  Abigail Miyamoto 07/21/2021

## 2021-07-21 NOTE — Progress Notes (Signed)
PROGRESS NOTE  Jason Dougherty MVH:[id number] DOB: [birth year] DOA: 07/18/2021 PCP: [facility]  HPI/Recap of past 89 hours:  50 year old male who was admitted for abdominal pain and admitted for acute biliary pancreatitis being followed by GI for possible gallstone pancreatitis Subjective: Patient seen and examined at bedside He stated that he feels better Denies any abdominal pain nausea vomiting Requesting to advance his diet  Assessment/Plan: Principal Problem:   Acute pancreatitis  1.  Gallstone pancreatitis.  Surgery and GI on board Pancreatitis is improving Surgery is possibly planning on doing cholecystectomy  2.  Hyperbilirubinemia Bilirubin is a still elevated but improving  3.  Elevated LFTs Due to the gallstones  4.  Thrombocytopenia Likely due to his acute illness We will continue to follow CBC  Code Status: Full  Severity of Illness: The appropriate patient status for this patient is INPATIENT. Inpatient status is judged to be reasonable and necessary in order to provide the required intensity of service to ensure the patient's safety. The patient's presenting symptoms, physical exam findings, and initial radiographic and laboratory data in the context of their chronic comorbidities is felt to place them at high risk for further clinical deterioration. Furthermore, it is not anticipated that the patient will be medically stable for discharge from the hospital within 2 midnights of admission.   * I certify that at the point of admission it is my clinical judgment that the patient will require inpatient hospital care spanning beyond 2 midnights from the point of admission due to high intensity of service, high risk for further deterioration and high frequency of surveillance required.*   Family Communication: None at bedside  Disposition Plan:   Status is: Inpatient   Dispo: The patient is from: Home              Anticipated d/c is to:                Anticipated d/c date is:               Patient currently not medically stable for discharge  Consultants: GI General surgery  Procedures: MRCP  Antimicrobials: None  DVT prophylaxis: SCD   Objective: Vitals:   07/19/21 2130 07/20/21 0516 07/20/21 1349 07/20/21 2109  BP: 131/75 132/79 140/81 139/87  Pulse: 92 86 84 80  Resp: 17 17 18 18   Temp: 99.2 F (37.3 C) 99.6 F (37.6 C) 99.5 F (37.5 C) 99.5 F (37.5 C)  TempSrc:   Oral Oral  SpO2: 93% 94% 95% 98%  Weight:      Height:        Intake/Output Summary (Last 24 hours) at 07/21/2021 1008 Last data filed at 07/20/2021 1800 Gross per 24 hour  Intake 1197.32 ml  Output --  Net 1197.32 ml   Filed Weights   07/18/21 1027  Weight: 88.5 kg   Body mass index is 31 kg/m.  Exam:  General: 50 y.o. year-old male well developed well nourished in no acute distress.  Alert and oriented x3. Cardiovascular: Regular rate and rhythm with no rubs or gallops.  No thyromegaly or JVD noted.   Respiratory: Clear to auscultation with no wheezes or rales. Good inspiratory effort. Abdomen: Soft nontender nondistended with normal bowel sounds x4 quadrants. Musculoskeletal: No lower extremity edema. 2/4 pulses in all 4 extremities. Skin: No ulcerative lesions noted or rashes, Psychiatry: Mood is appropriate for condition and setting Neurology:    Data Reviewed: CBC: Recent Labs  Lab 07/18/21 1057 07/19/21  3299 07/20/21 0758 07/21/21 0341  WBC 7.4 9.7 8.5 7.3  HGB 16.0 13.9 12.4* 11.6*  HCT 48.6 41.4 37.5* 34.8*  MCV 92.2 92.8 92.1 92.1  PLT 183 161 143* 149*   Basic Metabolic Panel: Recent Labs  Lab 07/18/21 1057 07/19/21 0351 07/20/21 0758 07/21/21 0341  NA 136 135 136 138  K 3.9 3.7 3.9 3.5  CL 101 104 105 105  CO2 28 23 23 26   GLUCOSE 110* 92 80 97  BUN 12 15 13 8   CREATININE 1.16 1.12 1.16 1.06  CALCIUM 9.0 8.4* 8.1* 8.1*   GFR: Estimated Creatinine Clearance: 85.8 mL/min (by C-G formula based on SCr  of 1.06 mg/dL). Liver Function Tests: Recent Labs  Lab 07/18/21 1057 07/19/21 0351 07/20/21 0758 07/21/21 0341  AST 339* 139* 64* 54*  ALT 533* 320* 187* 137*  ALKPHOS 159* 129* 118 105  BILITOT 6.7* 6.8* 4.7* 4.4*  PROT 7.7 6.7 6.4* 6.2*  ALBUMIN 4.1 3.4* 3.1* 2.8*   Recent Labs  Lab 07/18/21 1057 07/19/21 0351 07/20/21 0758  LIPASE 452* 118* 47   No results for input(s): AMMONIA in the last 168 hours. Coagulation Profile: No results for input(s): INR, PROTIME in the last 168 hours. Cardiac Enzymes: No results for input(s): CKTOTAL, CKMB, CKMBINDEX, TROPONINI in the last 168 hours. BNP (last 3 results) No results for input(s): PROBNP in the last 8760 hours. HbA1C: No results for input(s): HGBA1C in the last 72 hours. CBG: No results for input(s): GLUCAP in the last 168 hours. Lipid Profile: Recent Labs    07/18/21 1521  CHOL 152  HDL 61  LDLCALC 86  TRIG 25  CHOLHDL 2.5   Thyroid Function Tests: No results for input(s): TSH, T4TOTAL, FREET4, T3FREE, THYROIDAB in the last 72 hours. Anemia Panel: No results for input(s): VITAMINB12, FOLATE, FERRITIN, TIBC, IRON, RETICCTPCT in the last 72 hours. Urine analysis:    Component Value Date/Time   COLORURINE AMBER (A) 07/18/2021 1550   APPEARANCEUR CLEAR 07/18/2021 1550   LABSPEC <=1.005 07/18/2021 1550   PHURINE 6.5 07/18/2021 1550   GLUCOSEU NEGATIVE 07/18/2021 1550   HGBUR NEGATIVE 07/18/2021 1550   BILIRUBINUR MODERATE (A) 07/18/2021 1550   KETONESUR TRACE (A) 07/18/2021 1550   PROTEINUR NEGATIVE 07/18/2021 1550   NITRITE NEGATIVE 07/18/2021 1550   LEUKOCYTESUR NEGATIVE 07/18/2021 1550   Sepsis Labs: @LABRCNTIP (procalcitonin:4,lacticidven:4)  ) Recent Results (from the past 240 hour(s))  Resp Panel by RT-PCR (Flu A&B, Covid) Nasopharyngeal Swab     Status: None   Collection Time: 07/18/21 12:08 PM   Specimen: Nasopharyngeal Swab; Nasopharyngeal(NP) swabs in vial transport medium  Result Value Ref  Range Status   SARS Coronavirus 2 by RT PCR NEGATIVE NEGATIVE Final    Comment: (NOTE) SARS-CoV-2 target nucleic acids are NOT DETECTED.  The SARS-CoV-2 RNA is generally detectable in upper respiratory specimens during the acute phase of infection. The lowest concentration of SARS-CoV-2 viral copies this assay can detect is 138 copies/mL. A negative result does not preclude SARS-Cov-2 infection and should not be used as the sole basis for treatment or other patient management decisions. A negative result may occur with  improper specimen collection/handling, submission of specimen other than nasopharyngeal swab, presence of viral mutation(s) within the areas targeted by this assay, and inadequate number of viral copies(<138 copies/mL). A negative result must be combined with clinical observations, patient history, and epidemiological information. The expected result is Negative.  Fact Sheet for Patients:  14/02/2021  Fact Sheet for Healthcare Providers:  SeriousBroker.it  This test is no t yet approved or cleared by the [place] and  has been authorized for detection and/or diagnosis of SARS-CoV-2 by FDA under an Emergency Use Authorization (EUA). This EUA will remain  in effect (meaning this test can be used) for the duration of the COVID-19 declaration under Section 564(b)(1) of the Act, 21 U.S.C.section 360bbb-3(b)(1), unless the authorization is terminated  or revoked sooner.       Influenza A by PCR NEGATIVE NEGATIVE Final   Influenza B by PCR NEGATIVE NEGATIVE Final    Comment: (NOTE) The Xpert Xpress SARS-CoV-2/FLU/RSV plus assay is intended as an aid in the diagnosis of influenza from Nasopharyngeal swab specimens and should not be used as a sole basis for treatment. Nasal washings and aspirates are unacceptable for Xpert Xpress SARS-CoV-2/FLU/RSV testing.  Fact Sheet for  Patients: BloggerCourse.com  Fact Sheet for Healthcare Providers: SeriousBroker.it  This test is not yet approved or cleared by the [place] FDA and has been authorized for detection and/or diagnosis of SARS-CoV-2 by FDA under an Emergency Use Authorization (EUA). This EUA will remain in effect (meaning this test can be used) for the duration of the COVID-19 declaration under Section 564(b)(1) of the Act, 21 U.S.C. section 360bbb-3(b)(1), unless the authorization is terminated or revoked.  Performed at [facility], [address], [place], [place] [address]       Studies: No results found.  Scheduled Meds:  acyclovir ointment   Topical Q3H   amLODipine  5 mg Oral Daily   pantoprazole (PROTONIX) IV  40 mg Intravenous Q24H    Continuous Infusions:  sodium chloride 75 mL/hr at 07/20/21 2013     LOS: 3 days     Myrtie Neither, MD [facility]  To reach me or the doctor on call, go to: [email or web address] Password [email or web address]  07/21/2021, 10:08 AM

## 2021-07-22 DIAGNOSIS — D696 Thrombocytopenia, unspecified: Secondary | ICD-10-CM

## 2021-07-22 DIAGNOSIS — K8020 Calculus of gallbladder without cholecystitis without obstruction: Secondary | ICD-10-CM

## 2021-07-22 DIAGNOSIS — R7989 Other specified abnormal findings of blood chemistry: Secondary | ICD-10-CM

## 2021-07-22 LAB — COMPREHENSIVE METABOLIC PANEL
ALT: 121 U/L — ABNORMAL HIGH (ref 0–44)
AST: 57 U/L — ABNORMAL HIGH (ref 15–41)
Albumin: 2.9 g/dL — ABNORMAL LOW (ref 3.5–5.0)
Alkaline Phosphatase: 105 U/L (ref 38–126)
Anion gap: 6 (ref 5–15)
BUN: 6 mg/dL (ref 6–20)
CO2: 26 mmol/L (ref 22–32)
Calcium: 8.3 mg/dL — ABNORMAL LOW (ref 8.9–10.3)
Chloride: 107 mmol/L (ref 98–111)
Creatinine, Ser: 0.96 mg/dL (ref 0.61–1.24)
GFR, Estimated: >60 mL/min (ref >60)
Glucose, Bld: 101 mg/dL — ABNORMAL HIGH (ref 70–99)
Potassium: 3.3 mmol/L — ABNORMAL LOW (ref 3.5–5.1)
Sodium: 139 mmol/L (ref 135–145)
Total Bilirubin: 2.6 mg/dL — ABNORMAL HIGH (ref 0.3–1.2)
Total Protein: 6.2 g/dL — ABNORMAL LOW (ref 6.5–8.1)

## 2021-07-22 MED ORDER — POTASSIUM CHLORIDE CRYS ER 20 MEQ PO TBCR
40.0000 meq | EXTENDED_RELEASE_TABLET | ORAL | Status: AC
  Administered 2021-07-22 (×2): 40 meq via ORAL
  Filled 2021-07-22 (×2): qty 2

## 2021-07-22 NOTE — Assessment & Plan Note (Signed)
--   Minimal, probably secondary to acute illness, follow periodically.

## 2021-07-22 NOTE — Progress Notes (Signed)
   Subjective/Chief Complaint: He reports that he still has some epigastric abdominal discomfort.  He is having some difficulty sleeping without CPAP and he does not like the hospital CPAP device.  He did tolerate full liquids   Objective: Vital signs in last 24 hours: Temp:  [99 F (37.2 C)-100.1 F (37.8 C)] 99 F (37.2 C) (12/11 0535) Pulse Rate:  [68-79] 70 (12/11 0535) Resp:  [16-17] 16 (12/11 0535) BP: (122-138)/(72-88) 138/75 (12/11 0535) SpO2:  [95 %-100 %] 95 % (12/11 0535) Last BM Date: 07/21/21  Intake/Output from previous day: 12/10 0701 - 12/11 0700 In: 840 [P.O.:840] Out: -  Intake/Output this shift: No intake/output data recorded.  Exam: He is awake and alert.  He appears comfortable His abdomen is soft.  There is still some mild epigastric tenderness with guarding and fullness  Lab Results:  Recent Labs    07/20/21 0758 07/21/21 0341  WBC 8.5 7.3  HGB 12.4* 11.6*  HCT 37.5* 34.8*  PLT 143* 149*   BMET Recent Labs    07/21/21 0341 07/22/21 0341  NA 138 139  K 3.5 3.3*  CL 105 107  CO2 26 26  GLUCOSE 97 101*  BUN 8 6  CREATININE 1.06 0.96  CALCIUM 8.1* 8.3*   PT/INR No results for input(s): LABPROT, INR in the last 72 hours. ABG No results for input(s): PHART, HCO3 in the last 72 hours.  Invalid input(s): PCO2, PO2  Studies/Results: No results found.  Anti-infectives: Anti-infectives (From admission, onward)    None       Assessment/Plan: Gallstone Pancreatitis Elevated LFT's, hyperbilirubinemia   His bilirubin this morning is down to 2.6.  I again had a long discussion with him regarding gallbladder disease, symptomatic stones and gallstone pancreatitis.  Again, a laparoscopic cholecystectomy with cholangiogram is recommended.  He is still mentally coming to terms with the diagnosis and with surgery as he has had no previous abdominal surgery.  I again discussed the surgical procedure in detail including the risks.  He again  declined surgery this morning and wants to think about it for 1 more day before ultimately making his decision.  I will thus let him to full liquids and will again make him n.p.o. at midnight.  Abigail Miyamoto 07/22/2021

## 2021-07-22 NOTE — Progress Notes (Signed)
Patient continues not wanting to wear CPAP.RT encouraged patient to call if he changes his mind.

## 2021-07-22 NOTE — Plan of Care (Signed)
  Problem: Pain Managment: Goal: General experience of comfort will improve Outcome: Progressing   Problem: Pain Managment: Goal: General experience of comfort will improve Outcome: Progressing   Problem: Pain Managment: Goal: General experience of comfort will improve Outcome: Progressing   

## 2021-07-22 NOTE — Hospital Course (Addendum)
50 year old man presented with intense epigastric abdominal pain. --12/7 admitted for acute biliary pancreatitis. --12/8 MRCP no choledocholithiasis. --12/11 patient considering cholecystectomy. --12/12 stable, lap chole w/ IOC

## 2021-07-22 NOTE — Assessment & Plan Note (Addendum)
--   Secondary to gallstone pancreatitis.  MRCP unrevealing.  Total bilirubin trending down.  Transaminases without significant change.  Follow-up as an outpatient to resolution.

## 2021-07-22 NOTE — Assessment & Plan Note (Signed)
--   Symptomatic.  Plan for cholecystectomy if patient concurs.

## 2021-07-22 NOTE — Progress Notes (Signed)
  Progress Note Jason Dougherty   LOV:[id number]  DOB: 03-23-1971  DOA: 07/18/2021     4 Date of Service: 07/22/2021   Clinical Course 50 year old man presented with intense epigastric abdominal pain. --12/7 admitted for acute biliary pancreatitis. --12/8 MRCP no choledocholithiasis. --12/11 patient considering cholecystectomy.  Assessment and Plan * Acute gallstone pancreatitis -- Clinically resolved.  Tolerating diet.  No nausea or vomiting.  Somewhat hesitant about proceeding with cholecystectomy but he is leaning towards this. -- Total bilirubin trending down.  Check CMP in AM.  Cholelithiasis -- Symptomatic.  Plan for cholecystectomy if patient concurs.  Abnormal LFTs -- Secondary to gallstone pancreatitis.  MRCP unrevealing.  Total bilirubin trending down.  Trend.  Thrombocytopenia (HCC) -- Minimal, probably secondary to acute illness, follow periodically.  Subjective:  Feels better.  No abdominal pain, nausea or vomiting.  Considering cholecystectomy.  Objective Vitals:   07/21/21 1347 07/21/21 2155 07/22/21 0535 07/22/21 1348  BP: 122/72 138/88 138/75 126/86  Pulse: 68 79 70 72  Resp: 16 17 16 16   Temp: 99.2 F (37.3 C) 100.1 F (37.8 C) 99 F (37.2 C) 99.2 F (37.3 C)  TempSrc: Oral Oral Oral Oral  SpO2: 98% 100% 95% 100%  Weight:      Height:       88.5 kg  Vital signs were reviewed and unremarkable.   Exam Physical Exam Vitals reviewed.  Constitutional:      General: He is not in acute distress.    Appearance: He is not ill-appearing.  Cardiovascular:     Rate and Rhythm: Normal rate and regular rhythm.     Heart sounds: No murmur heard. Pulmonary:     Effort: No respiratory distress.     Breath sounds: No wheezing, rhonchi or rales.  Abdominal:     Tenderness: There is no abdominal tenderness.  Musculoskeletal:     Right lower leg: No edema.     Left lower leg: No edema.  Psychiatric:        Mood and Affect: Mood normal.        Behavior:  Behavior normal.    Labs / Other Information My review of labs, imaging, notes and other tests is significant for potassium 3.3,    AST and ALT without significant change.  Total bilirubin down to 2.6.  Disposition Plan: Status is: Inpatient  Remains inpatient appropriate because: Gallstone pancreatitis.  Plan for cholecystectomy.  SCDs for DVT proph in anticipation of surgery   Time spent: 35 minutes [facility] 07/22/2021, 5:03 PM

## 2021-07-22 NOTE — Assessment & Plan Note (Addendum)
--   Clinically resolved.  Tolerating diet.  No nausea or vomiting.  Waiting to talk to surgeon today about possible surgery. -- Total bilirubin slowly trending down.  Transaminases without significant change.

## 2021-07-22 NOTE — Plan of Care (Signed)
  Problem: Education: Goal: Knowledge of General Education information will improve Description: Including pain rating scale, medication(s)/side effects and non-pharmacologic comfort measures Outcome: Progressing   Problem: Activity: Goal: Risk for activity intolerance will decrease Outcome: Progressing   Problem: Nutrition: Goal: Adequate nutrition will be maintained Outcome: Progressing   

## 2021-07-23 ENCOUNTER — Inpatient Hospital Stay (HOSPITAL_COMMUNITY): Payer: No Typology Code available for payment source | Admitting: Certified Registered"

## 2021-07-23 ENCOUNTER — Encounter (HOSPITAL_COMMUNITY)
Admission: EM | Disposition: A | Discharge status: Home / Self Care | Payer: Self-pay | Source: Home / Self Care | Attending: Family Medicine

## 2021-07-23 ENCOUNTER — Inpatient Hospital Stay (HOSPITAL_COMMUNITY): Payer: No Typology Code available for payment source

## 2021-07-23 HISTORY — PX: CHOLECYSTECTOMY: SHX55

## 2021-07-23 LAB — COMPREHENSIVE METABOLIC PANEL
ALT: 117 U/L — ABNORMAL HIGH (ref 0–44)
AST: 63 U/L — ABNORMAL HIGH (ref 15–41)
Albumin: 3 g/dL — ABNORMAL LOW (ref 3.5–5.0)
Alkaline Phosphatase: 118 U/L (ref 38–126)
Anion gap: 8 (ref 5–15)
BUN: 6 mg/dL (ref 6–20)
CO2: 26 mmol/L (ref 22–32)
Calcium: 8.8 mg/dL — ABNORMAL LOW (ref 8.9–10.3)
Chloride: 104 mmol/L (ref 98–111)
Creatinine, Ser: 1.19 mg/dL (ref 0.61–1.24)
GFR, Estimated: >60 mL/min (ref >60)
Glucose, Bld: 102 mg/dL — ABNORMAL HIGH (ref 70–99)
Potassium: 3.9 mmol/L (ref 3.5–5.1)
Sodium: 138 mmol/L (ref 135–145)
Total Bilirubin: 2.1 mg/dL — ABNORMAL HIGH (ref 0.3–1.2)
Total Protein: 6.5 g/dL (ref 6.5–8.1)

## 2021-07-23 LAB — SURGICAL PCR SCREEN
MRSA, PCR: NEGATIVE
Staphylococcus aureus: NEGATIVE

## 2021-07-23 SURGERY — LAPAROSCOPIC CHOLECYSTECTOMY WITH INTRAOPERATIVE CHOLANGIOGRAM
Anesthesia: General | Site: Abdomen

## 2021-07-23 MED ORDER — DEXAMETHASONE SODIUM PHOSPHATE 10 MG/ML IJ SOLN
INTRAMUSCULAR | Status: DC | PRN
  Administered 2021-07-23: 4 mg via INTRAVENOUS

## 2021-07-23 MED ORDER — OXYCODONE HCL 5 MG/5ML PO SOLN: 5.0000 mg | Freq: Once | ORAL | Status: DC | PRN

## 2021-07-23 MED ORDER — FENTANYL CITRATE PF 50 MCG/ML IJ SOSY
PREFILLED_SYRINGE | INTRAMUSCULAR | Status: AC
  Filled 2021-07-23: qty 3

## 2021-07-23 MED ORDER — ONDANSETRON HCL 4 MG/2ML IJ SOLN: 4.0000 mg | Freq: Four times a day (QID) | INTRAMUSCULAR | Status: DC | PRN

## 2021-07-23 MED ORDER — PHENYLEPHRINE 40 MCG/ML (10ML) SYRINGE FOR IV PUSH (FOR BLOOD PRESSURE SUPPORT)
PREFILLED_SYRINGE | INTRAVENOUS | Status: DC | PRN
Start: 2021-07-23 — End: 2021-07-23
  Administered 2021-07-23: 40 ug via INTRAVENOUS
  Administered 2021-07-23: 80 ug via INTRAVENOUS

## 2021-07-23 MED ORDER — IOPAMIDOL (ISOVUE-300) INJECTION 61%
INTRAVENOUS | Status: DC | PRN
  Administered 2021-07-23: 3 mL

## 2021-07-23 MED ORDER — CEFAZOLIN SODIUM-DEXTROSE 2-4 GM/100ML-% IV SOLN
2.0000 g | INTRAVENOUS | Status: AC
  Administered 2021-07-23: 2 g via INTRAVENOUS
  Filled 2021-07-23: qty 100

## 2021-07-23 MED ORDER — PROPOFOL 10 MG/ML IV BOLUS
INTRAVENOUS | Status: DC | PRN
  Administered 2021-07-23: 200 mg via INTRAVENOUS

## 2021-07-23 MED ORDER — ROCURONIUM BROMIDE 10 MG/ML (PF) SYRINGE
PREFILLED_SYRINGE | INTRAVENOUS | Status: DC | PRN
  Administered 2021-07-23: 80 mg via INTRAVENOUS

## 2021-07-23 MED ORDER — LACTATED RINGERS IV SOLN: INTRAVENOUS | Status: DC

## 2021-07-23 MED ORDER — MIDAZOLAM HCL 2 MG/2ML IJ SOLN
INTRAMUSCULAR | Status: DC | PRN
  Administered 2021-07-23: 2 mg via INTRAVENOUS

## 2021-07-23 MED ORDER — PROMETHAZINE HCL 25 MG/ML IJ SOLN: 6.2500 mg | INTRAMUSCULAR | Status: DC | PRN

## 2021-07-23 MED ORDER — RINGERS IRRIGATION IR SOLN
Status: DC | PRN
  Administered 2021-07-23: 1

## 2021-07-23 MED ORDER — METHOCARBAMOL 500 MG PO TABS
500.0000 mg | ORAL_TABLET | Freq: Four times a day (QID) | ORAL | Status: DC | PRN
  Administered 2021-07-23 – 2021-07-24 (×3): 500 mg via ORAL
  Filled 2021-07-23 (×3): qty 1

## 2021-07-23 MED ORDER — SUGAMMADEX SODIUM 200 MG/2ML IV SOLN
INTRAVENOUS | Status: DC | PRN
Start: 2021-07-23 — End: 2021-07-23
  Administered 2021-07-23: 200 mg via INTRAVENOUS

## 2021-07-23 MED ORDER — OXYCODONE HCL 5 MG PO TABS
5.0000 mg | ORAL_TABLET | ORAL | Status: DC | PRN
Start: 2021-07-23 — End: 2021-07-24
  Administered 2021-07-23: 5 mg via ORAL
  Filled 2021-07-23: qty 1

## 2021-07-23 MED ORDER — BUPIVACAINE-EPINEPHRINE 0.25% -1:200000 IJ SOLN
INTRAMUSCULAR | Status: DC | PRN
  Administered 2021-07-23: 8 mL

## 2021-07-23 MED ORDER — FENTANYL CITRATE (PF) 100 MCG/2ML IJ SOLN
INTRAMUSCULAR | Status: AC
  Filled 2021-07-23: qty 2

## 2021-07-23 MED ORDER — ONDANSETRON HCL 4 MG/2ML IJ SOLN
INTRAMUSCULAR | Status: AC
  Filled 2021-07-23: qty 2

## 2021-07-23 MED ORDER — MORPHINE SULFATE (PF) 2 MG/ML IV SOLN: 2.0000 mg | INTRAVENOUS | Status: DC | PRN

## 2021-07-23 MED ORDER — DROPERIDOL 2.5 MG/ML IJ SOLN: 0.6250 mg | Freq: Once | INTRAMUSCULAR | Status: DC | PRN

## 2021-07-23 MED ORDER — MIDAZOLAM HCL 2 MG/2ML IJ SOLN
INTRAMUSCULAR | Status: AC
  Filled 2021-07-23: qty 2

## 2021-07-23 MED ORDER — DEXAMETHASONE SODIUM PHOSPHATE 10 MG/ML IJ SOLN
INTRAMUSCULAR | Status: AC
  Filled 2021-07-23: qty 1

## 2021-07-23 MED ORDER — OXYCODONE HCL 5 MG PO TABS: 5.0000 mg | ORAL_TABLET | Freq: Once | ORAL | Status: DC | PRN

## 2021-07-23 MED ORDER — FENTANYL CITRATE (PF) 100 MCG/2ML IJ SOLN
INTRAMUSCULAR | Status: DC | PRN
  Administered 2021-07-23 (×2): 50 ug via INTRAVENOUS

## 2021-07-23 MED ORDER — CHLORHEXIDINE GLUCONATE CLOTH 2 % EX PADS: 6.0000 | MEDICATED_PAD | Freq: Once | CUTANEOUS | Status: DC

## 2021-07-23 MED ORDER — FENTANYL CITRATE PF 50 MCG/ML IJ SOSY
25.0000 ug | PREFILLED_SYRINGE | INTRAMUSCULAR | Status: DC | PRN
  Administered 2021-07-23 (×3): 50 ug via INTRAVENOUS

## 2021-07-23 MED ORDER — BUPIVACAINE-EPINEPHRINE 0.25% -1:200000 IJ SOLN
INTRAMUSCULAR | Status: AC
  Filled 2021-07-23: qty 1

## 2021-07-23 MED ORDER — LIDOCAINE HCL (PF) 2 % IJ SOLN
INTRAMUSCULAR | Status: AC
  Filled 2021-07-23: qty 5

## 2021-07-23 MED ORDER — LIDOCAINE 2% (20 MG/ML) 5 ML SYRINGE
INTRAMUSCULAR | Status: DC | PRN
Start: 2021-07-23 — End: 2021-07-23
  Administered 2021-07-23: 50 mg via INTRAVENOUS

## 2021-07-23 MED ORDER — ACETAMINOPHEN 500 MG PO TABS
1000.0000 mg | ORAL_TABLET | Freq: Once | ORAL | Status: AC
  Administered 2021-07-23: 1000 mg via ORAL
  Filled 2021-07-23: qty 2

## 2021-07-23 MED ORDER — ONDANSETRON HCL 4 MG/2ML IJ SOLN
INTRAMUSCULAR | Status: DC | PRN
  Administered 2021-07-23: 4 mg via INTRAVENOUS

## 2021-07-23 MED ORDER — CHLORHEXIDINE GLUCONATE CLOTH 2 % EX PADS: 6.0000 | MEDICATED_PAD | Freq: Once | CUTANEOUS | Status: AC

## 2021-07-23 SURGICAL SUPPLY — 36 items
APPLIER CLIP ROT 10 11.4 M/L (STAPLE) ×2
BAG COUNTER SPONGE SURGICOUNT (BAG) IMPLANT
CHLORAPREP W/TINT 26 (MISCELLANEOUS) ×2 IMPLANT
CLIP APPLIE ROT 10 11.4 M/L (STAPLE) ×1 IMPLANT
COVER MAYO STAND STRL (DRAPES) ×2 IMPLANT
DECANTER SPIKE VIAL GLASS SM (MISCELLANEOUS) ×2 IMPLANT
DERMABOND ADVANCED (GAUZE/BANDAGES/DRESSINGS) ×1
DERMABOND ADVANCED .7 DNX12 (GAUZE/BANDAGES/DRESSINGS) IMPLANT
DRAPE C-ARM 42X120 X-RAY (DRAPES) ×2 IMPLANT
DRAPE UTILITY XL STRL (DRAPES) ×2 IMPLANT
DRAPE WARM FLUID 44X44 (DRAPES) ×2 IMPLANT
ELECT REM PT RETURN 15FT ADLT (MISCELLANEOUS) ×2 IMPLANT
GAUZE 4X4 16PLY ~~LOC~~+RFID DBL (SPONGE) ×1 IMPLANT
GLOVE SURG MICRO LTX SZ7.5 (GLOVE) ×2 IMPLANT
GLOVE SURG UNDER LTX SZ8 (GLOVE) ×2 IMPLANT
GOWN STRL REUS W/TWL XL LVL3 (GOWN DISPOSABLE) ×4 IMPLANT
HEMOSTAT SURGICEL 4X8 (HEMOSTASIS) IMPLANT
IRRIG SUCT STRYKERFLOW 2 WTIP (MISCELLANEOUS) ×2
IRRIGATION SUCT STRKRFLW 2 WTP (MISCELLANEOUS) ×1 IMPLANT
KIT BASIN OR (CUSTOM PROCEDURE TRAY) ×2 IMPLANT
KIT TURNOVER KIT A (KITS) IMPLANT
PENCIL SMOKE EVACUATOR (MISCELLANEOUS) IMPLANT
POUCH SPECIMEN RETRIEVAL 10MM (ENDOMECHANICALS) ×2 IMPLANT
PROTECTOR NERVE ULNAR (MISCELLANEOUS) IMPLANT
SCISSORS LAP 5X35 DISP (ENDOMECHANICALS) IMPLANT
SET CHOLANGIOGRAPH MIX (MISCELLANEOUS) ×2 IMPLANT
SET TUBE SMOKE EVAC HIGH FLOW (TUBING) IMPLANT
SLEEVE XCEL OPT CAN 5 100 (ENDOMECHANICALS) ×2 IMPLANT
SUT MNCRL AB 4-0 PS2 18 (SUTURE) ×2 IMPLANT
TAPE CLOTH 4X10 WHT NS (GAUZE/BANDAGES/DRESSINGS) IMPLANT
TOWEL OR 17X26 10 PK STRL BLUE (TOWEL DISPOSABLE) ×2 IMPLANT
TOWEL OR NON WOVEN STRL DISP B (DISPOSABLE) ×2 IMPLANT
TRAY LAPAROSCOPIC (CUSTOM PROCEDURE TRAY) ×2 IMPLANT
TROCAR BLADELESS OPT 5 100 (ENDOMECHANICALS) ×2 IMPLANT
TROCAR XCEL BLUNT TIP 100MML (ENDOMECHANICALS) ×2 IMPLANT
TROCAR XCEL NON-BLD 11X100MML (ENDOMECHANICALS) ×2 IMPLANT

## 2021-07-23 NOTE — Plan of Care (Signed)
  Problem: Education: Goal: Required Educational Video(s) Outcome: Progressing   

## 2021-07-23 NOTE — Interval H&P Note (Signed)
History and Physical Interval Note:  07/23/2021 11:41 AM  Jason Dougherty  has presented today for surgery, with the diagnosis of gallstones, pancreatitis.  The various methods of treatment have been discussed with the patient and family. After consideration of risks, benefits and other options for treatment, the patient has consented to  Procedure(s): LAPAROSCOPIC CHOLECYSTECTOMY WITH INTRAOPERATIVE CHOLANGIOGRAM (N/A) as a surgical intervention.  The patient's history has been reviewed, patient examined, no change in status, stable for surgery.  I have reviewed the patient's chart and labs.  Questions were answered to the patient's satisfaction.    The procedure has been discussed with the patient. Operative and non operative treatments have been discussed. Risks of surgery include bleeding, infection,  Common bile duct injury,  Injury to the stomach,liver, colon,small intestine, abdominal wall,  Diaphragm,  Major blood vessels,  And the need for an open procedure.  Other risks include worsening of medical problems, death,  DVT and pulmonary embolism, and cardiovascular events.   Medical options have also been discussed. The patient has been informed of long term expectations of surgery and non surgical options,  The patient agrees to proceed.    [name]

## 2021-07-23 NOTE — Discharge Instructions (Signed)
[facility]  Please arrive at least 30 min before your appointment to complete your check in paperwork.  If you are unable to arrive 30 min prior to your appointment time we may have to cancel or reschedule you. LAPAROSCOPIC SURGERY: POST OP INSTRUCTIONS Always review your discharge instruction sheet given to you by the facility where your surgery was performed. IF YOU HAVE DISABILITY OR FAMILY LEAVE FORMS, YOU MUST BRING THEM TO THE OFFICE FOR PROCESSING.   DO NOT GIVE THEM TO YOUR DOCTOR.  PAIN CONTROL  First take acetaminophen (Tylenol) AND/or ibuprofen (Advil) to control your pain after surgery.  Follow directions on package.  Taking acetaminophen (Tylenol) and/or ibuprofen (Advil) regularly after surgery will help to control your pain and lower the amount of prescription pain medication you may need.  You should not take more than 4,000 mg (4 grams) of acetaminophen (Tylenol) in 24 hours.  You should not take ibuprofen (Advil), aleve, motrin, naprosyn or other NSAIDS if you have a history of stomach ulcers or chronic kidney disease.  A prescription for pain medication may be given to you upon discharge.  Take your pain medication as prescribed, if you still have uncontrolled pain after taking acetaminophen (Tylenol) or ibuprofen (Advil). Use ice packs to help control pain. If you need a refill on your pain medication, please contact your pharmacy.  They will contact our office to request authorization. Prescriptions will not be filled after 5pm or on week-ends.  HOME MEDICATIONS Take your usually prescribed medications unless otherwise directed.  DIET You should follow a light diet the first few days after arrival home.  Be sure to include lots of fluids daily. Avoid fatty, fried foods.   CONSTIPATION It is common to experience some constipation after surgery and if you are taking pain medication.  Increasing fluid intake and taking a stool softener (such as Colace)  will usually help or prevent this problem from occurring.  A mild laxative (Milk of Magnesia or Miralax) should be taken according to package instructions if there are no bowel movements after 48 hours.  WOUND/INCISION CARE Most patients will experience some swelling and bruising in the area of the incisions.  Ice packs will help.  Swelling and bruising can take several days to resolve.  Unless discharge instructions indicate otherwise, follow guidelines below  STERI-STRIPS - you may remove your outer bandages 48 hours after surgery, and you may shower at that time.  You have steri-strips (small skin tapes) in place directly over the incision.  These strips should be left on the skin for 7-10 days.   DERMABOND/SKIN GLUE - you may shower in 24 hours.  The glue will flake off over the next 2-3 weeks. Any sutures or staples will be removed at the office during your follow-up visit.  ACTIVITIES You may resume regular (light) daily activities beginning the next day--such as daily self-care, walking, climbing stairs--gradually increasing activities as tolerated.  You may have sexual intercourse when it is comfortable.  Refrain from any heavy lifting or straining until approved by your doctor. You may drive when you are no longer taking prescription pain medication, you can comfortably wear a seatbelt, and you can safely maneuver your car and apply brakes.  FOLLOW-UP You should see your doctor in the office for a follow-up appointment approximately 2-3 weeks after your surgery.  You should have been given your post-op/follow-up appointment when your surgery was scheduled.  If you did not receive a post-op/follow-up appointment, make sure   that you call for this appointment within a day or two after you arrive home to insure a convenient appointment time.   WHEN TO CALL YOUR DOCTOR: Fever over 101.0 Inability to urinate Continued bleeding from incision. Increased pain, redness, or drainage from the  incision. Increasing abdominal pain  The clinic staff is available to answer your questions during regular business hours.  Please don't hesitate to call and ask to speak to one of the nurses for clinical concerns.  If you have a medical emergency, go to the nearest emergency room or call 911.  A surgeon from [facility] is always on call at the hospital. [address], [address], [place], [place]  [address] ? [address] [address], [place], [place]   [address] [phone] ? [phone] ? FAX [phone]  

## 2021-07-23 NOTE — Progress Notes (Signed)
   Subjective/Chief Complaint: Pt without complaint  Ready to have gallbladder out    Objective: Vital signs in last 24 hours: Temp:  [98.8 F (37.1 C)-99.3 F (37.4 C)] 98.8 F (37.1 C) (12/12 0520) Pulse Rate:  [65-72] 67 (12/12 0520) Resp:  [16-20] 20 (12/12 0520) BP: (125-143)/(80-86) 125/80 (12/12 0520) SpO2:  [97 %-100 %] 97 % (12/12 0520) Last BM Date: 07/22/21  Intake/Output from previous day: 12/11 0701 - 12/12 0700 In: 840 [P.O.:840] Out: 0  Intake/Output this shift: No intake/output data recorded.  General appearance: alert and cooperative Cardio: regular rate and rhythm GI: soft min RUQ tenderness no mass   Lab Results:  Recent Labs    07/21/21 0341  WBC 7.3  HGB 11.6*  HCT 34.8*  PLT 149*   BMET Recent Labs    07/22/21 0341 07/23/21 0319  NA 139 138  K 3.3* 3.9  CL 107 104  CO2 26 26  GLUCOSE 101* 102*  BUN 6 6  CREATININE 0.96 1.19  CALCIUM 8.3* 8.8*   PT/INR No results for input(s): LABPROT, INR in the last 72 hours. ABG No results for input(s): PHART, HCO3 in the last 72 hours.  Invalid input(s): PCO2, PO2  Studies/Results: No results found.  Anti-infectives: Anti-infectives (From admission, onward)    None       Assessment/Plan: Gallstone pancreatitis     Pt has agreed to proceed to cholecystectomy after discussion this am  Explained procedure, complications, non opertaive management and outcomes/recovery  The procedure has been discussed with the patient. Operative and non operative treatments have been discussed. Risks of surgery include bleeding, infection,  Common bile duct injury,  Injury to the stomach,liver, colon,small intestine, abdominal wall,  Diaphragm,  Major blood vessels,  And the need for an open procedure.  Other risks include worsening of medical problems, death,  DVT and pulmonary embolism, and cardiovascular events.   Medical options have also been discussed. The patient has been informed of long  term expectations of surgery and non surgical options,  The patient agrees to proceed.      LOS: 5 days    Dortha Schwalbe MD  07/23/2021

## 2021-07-23 NOTE — Anesthesia Postprocedure Evaluation (Signed)
Anesthesia Post Note  Patient: Jason Dougherty  Procedure(s) Performed: LAPAROSCOPIC CHOLECYSTECTOMY WITH INTRAOPERATIVE CHOLANGIOGRAM (Abdomen)     Patient location during evaluation: PACU Anesthesia Type: General Level of consciousness: awake Pain management: pain level controlled Vital Signs Assessment: post-procedure vital signs reviewed and stable Respiratory status: spontaneous breathing and respiratory function stable Cardiovascular status: stable Postop Assessment: no apparent nausea or vomiting Anesthetic complications: no   No notable events documented.  Last Vitals:  Vitals:   07/23/21 1623 07/23/21 1722  BP: (!) 150/99 (!) 143/91  Pulse: 72 66  Resp: 16 16  Temp: 36.6 C 37.2 C  SpO2: 100% 99%    Last Pain:  Vitals:   07/23/21 1722  TempSrc: Oral  PainSc:                  Mellody Dance

## 2021-07-23 NOTE — Anesthesia Procedure Notes (Signed)
Procedure Name: Intubation Date/Time: 07/23/2021 12:31 PM Performed by: Niel Hummer, CRNA Pre-anesthesia Checklist: Patient identified, Emergency Drugs available, Suction available and Patient being monitored Patient Re-evaluated:Patient Re-evaluated prior to induction Oxygen Delivery Method: Circle system utilized Preoxygenation: Pre-oxygenation with 100% oxygen Induction Type: IV induction Ventilation: Mask ventilation without difficulty Laryngoscope Size: Mac and 4 Grade View: Grade I Tube type: Oral Tube size: 7.5 mm Number of attempts: 1 Airway Equipment and Method: Stylet Placement Confirmation: ETT inserted through vocal cords under direct vision, positive ETCO2 and breath sounds checked- equal and bilateral Secured at: 23 cm Tube secured with: Tape Dental Injury: Teeth and Oropharynx as per pre-operative assessment

## 2021-07-23 NOTE — Op Note (Signed)
Laparoscopic Cholecystectomy with IOC Procedure Note  Indications: This patient presents with symptomatic gallbladder disease / gallstone pancreatitis and will undergo laparoscopic cholecystectomy.The procedure has been discussed with the patient. Operative and non operative treatments have been discussed. Risks of surgery include bleeding, infection,  Common bile duct injury,  Injury to the stomach,liver, colon,small intestine, abdominal wall,  Diaphragm,  Major blood vessels,  And the need for an open procedure.  Other risks include worsening of medical problems, death,  DVT and pulmonary embolism, and cardiovascular events.   Medical options have also been discussed. The patient has been informed of long term expectations of surgery and non surgical options,  The patient agrees to proceed.     Pre-operative Diagnosis: gallstone pancreatitis   Post-operative Diagnosis: same   Surgeon: Dortha Schwalbe  MD   Assistants: OR staff   Anesthesia: General endotracheal anesthesia and Local anesthesia 0.25.% bupivacaine, with epinephrine  ASA Class: 2  Procedure Details  The patient was seen again in the Holding Room. The risks, benefits, complications, treatment options, and expected outcomes were discussed with the patient. The possibilities of reaction to medication, pulmonary aspiration, perforation of viscus, bleeding, recurrent infection, finding a normal gallbladder, the need for additional procedures, failure to diagnose a condition, the possible need to convert to an open procedure, and creating a complication requiring transfusion or operation were discussed with the patient. The patient and/or family concurred with the proposed plan, giving informed consent. The site of surgery properly noted/marked. The patient was taken to Operating Room, identified as Jason Dougherty and the procedure verified as Laparoscopic Cholecystectomy with Intraoperative Cholangiograms. A Time Out was held and the above  information confirmed.  Prior to the induction of general anesthesia, antibiotic prophylaxis was administered. General endotracheal anesthesia was then administered and tolerated well. After the induction, the abdomen was prepped in the usual sterile fashion. The patient was positioned in the supine position with the left arm comfortably tucked, along with some reverse Trendelenburg.  Local anesthetic agent was injected into the skin near the umbilicus and an incision made. The midline fascia was incised and the Hasson technique was used to introduce a 12 mm port under direct vision. It was secured with a figure of eight Vicryl suture placed in the usual fashion. Pneumoperitoneum was then created with CO2 and tolerated well without any adverse changes in the patient's vital signs. Additional trocars were introduced under direct vision with an 11 mm trocar in the epigastrium and two 5 mm trocars in the right upper quadrant. All skin incisions were infiltrated with a local anesthetic agent before making the incision and placing the trocars.   The gallbladder was identified, the fundus grasped and retracted cephalad. Adhesions were lysed bluntly and with the electrocautery where indicated, taking care not to injure any adjacent organs or viscus. The infundibulum was grasped and retracted laterally, exposing the peritoneum overlying the triangle of Calot. This was then divided and exposed in a blunt fashion. The cystic duct was clearly identified and bluntly dissected circumferentially. The junctions of the gallbladder, cystic duct and common bile duct were clearly identified prior to the division of any linear structure.   An incision was made in the cystic duct and the cholangiogram catheter introduced. The catheter was secured using an endoclip. The study showed no stones and good visualization of the distal and proximal biliary tree. The catheter was then removed.   The cystic duct was then  ligated with  surgical clips  on the  patient side and  clipped on the gallbladder side and divided. The cystic artery was identified, dissected free, ligated with clips and divided as well. Posterior cystic artery clipped and divided.  The gallbladder was dissected from the liver bed in retrograde fashion with the electrocautery. The gallbladder was removed. The liver bed was irrigated and inspected. Hemostasis was achieved with the electrocautery. Copious irrigation was utilized and was repeatedly aspirated until clear all particulate matter. Hemostasis was achieved with no signs  Of bleeding or bile leakage.  Pneumoperitoneum was completely reduced after viewing removal of the trocars under direct vision. The wound was thoroughly irrigated and the fascia was then closed with a figure of eight suture; the skin was then closed with 4 O monocryl  and a sterile dressing of Dermabond was applied.  Instrument, sponge, and needle counts were correct at closure and at the conclusion of the case.   Findings: Cholecystitis with Cholelithiasis  Estimated Blood Loss: less than 50 mL         Drains: none         Total IV Fluids per OR record          Specimens: Gallbladder           Complications: None; patient tolerated the procedure well.         Disposition: PACU - hemodynamically stable.         Condition: stable

## 2021-07-23 NOTE — Transfer of Care (Signed)
Immediate Anesthesia Transfer of Care Note  Patient: Jason Dougherty  Procedure(s) Performed: LAPAROSCOPIC CHOLECYSTECTOMY WITH INTRAOPERATIVE CHOLANGIOGRAM (Abdomen)  Patient Location: PACU  Anesthesia Type:General  Level of Consciousness: awake, alert  and oriented  Airway & Oxygen Therapy: Patient Spontanous Breathing and Patient connected to face mask oxygen  Post-op Assessment: Report given to RN, Post -op Vital signs reviewed and stable and Patient moving all extremities X 4  Post vital signs: Reviewed and stable  Last Vitals:  Vitals Value Taken Time  BP 147/96   Temp    Pulse 65   Resp    SpO2 100     Last Pain:  Vitals:   07/23/21 1152  TempSrc: Oral  PainSc:       Patients Stated Pain Goal: 5 (07/18/21 1027)  Complications: No notable events documented.

## 2021-07-23 NOTE — Plan of Care (Signed)
Plan of care reviewed and discussed with the patient. 

## 2021-07-23 NOTE — H&P (View-Only) (Signed)
   Subjective/Chief Complaint: Pt without complaint  Ready to have gallbladder out    Objective: Vital signs in last 24 hours: Temp:  [98.8 F (37.1 C)-99.3 F (37.4 C)] 98.8 F (37.1 C) (12/12 0520) Pulse Rate:  [65-72] 67 (12/12 0520) Resp:  [16-20] 20 (12/12 0520) BP: (125-143)/(80-86) 125/80 (12/12 0520) SpO2:  [97 %-100 %] 97 % (12/12 0520) Last BM Date: 07/22/21  Intake/Output from previous day: 12/11 0701 - 12/12 0700 In: 840 [P.O.:840] Out: 0  Intake/Output this shift: No intake/output data recorded.  General appearance: alert and cooperative Cardio: regular rate and rhythm GI: soft min RUQ tenderness no mass   Lab Results:  Recent Labs    07/21/21 0341  WBC 7.3  HGB 11.6*  HCT 34.8*  PLT 149*   BMET Recent Labs    07/22/21 0341 07/23/21 0319  NA 139 138  K 3.3* 3.9  CL 107 104  CO2 26 26  GLUCOSE 101* 102*  BUN 6 6  CREATININE 0.96 1.19  CALCIUM 8.3* 8.8*   PT/INR No results for input(s): LABPROT, INR in the last 72 hours. ABG No results for input(s): PHART, HCO3 in the last 72 hours.  Invalid input(s): PCO2, PO2  Studies/Results: No results found.  Anti-infectives: Anti-infectives (From admission, onward)    None       Assessment/Plan: Gallstone pancreatitis     Pt has agreed to proceed to cholecystectomy after discussion this am  Explained procedure, complications, non opertaive management and outcomes/recovery  The procedure has been discussed with the patient. Operative and non operative treatments have been discussed. Risks of surgery include bleeding, infection,  Common bile duct injury,  Injury to the stomach,liver, colon,small intestine, abdominal wall,  Diaphragm,  Major blood vessels,  And the need for an open procedure.  Other risks include worsening of medical problems, death,  DVT and pulmonary embolism, and cardiovascular events.   Medical options have also been discussed. The patient has been informed of long  term expectations of surgery and non surgical options,  The patient agrees to proceed.      LOS: 5 days    Dortha Schwalbe MD  07/23/2021

## 2021-07-23 NOTE — Progress Notes (Signed)
  Progress Note CHANOCH MCCLEERY   HQP:[id number]  DOB: 12/26/1970  DOA: 07/18/2021     5 Date of Service: 07/23/2021   Clinical Course 50 year old man presented with intense epigastric abdominal pain. --12/7 admitted for acute biliary pancreatitis. --12/8 MRCP no choledocholithiasis. --12/11 patient considering cholecystectomy. --12/12 stable  Assessment and Plan * Acute gallstone pancreatitis -- Clinically resolved.  Tolerating diet.  No nausea or vomiting.  Waiting to talk to surgeon today about possible surgery. -- Total bilirubin slowly trending down.  Transaminases without significant change.  Cholelithiasis -- Symptomatic.  Plan for cholecystectomy if patient concurs.  Abnormal LFTs -- Secondary to gallstone pancreatitis.  MRCP unrevealing.  Total bilirubin trending down.  Transaminases without significant change.  Follow-up as an outpatient to resolution.  Thrombocytopenia (HCC) -- Minimal, probably secondary to acute illness, follow periodically.  Subjective:  No pain, nausea or vomiting.  Objective Vitals:   07/22/21 0535 07/22/21 1348 07/22/21 2134 07/23/21 0520  BP: 138/75 126/86 (!) 143/86 125/80  Pulse: 70 72 65 67  Resp: 16 16 20 20   Temp: 99 F (37.2 C) 99.2 F (37.3 C) 99.3 F (37.4 C) 98.8 F (37.1 C)  TempSrc: Oral Oral Oral Oral  SpO2: 95% 100% 97% 97%  Weight:      Height:       88.5 kg  Vital signs were reviewed and unremarkable.  Exam Physical Exam Vitals reviewed.  Constitutional:      General: He is not in acute distress.    Appearance: He is not ill-appearing or toxic-appearing.  Cardiovascular:     Rate and Rhythm: Normal rate and regular rhythm.     Heart sounds: No murmur heard. Pulmonary:     Effort: Pulmonary effort is normal. No respiratory distress.     Breath sounds: No wheezing, rhonchi or rales.  Psychiatric:        Mood and Affect: Mood normal.        Behavior: Behavior normal.    Labs / Other Information My review of  labs, imaging, notes and other tests is significant for     AST, ALT w/o sig change. T bilie down to 2.1  Disposition Plan: Status is: Inpatient  Remains inpatient appropriate because: Gallstone pancreatitis, plan for cholecystectomy if patient allows  SCDs   Time spent: 25 minutes [facility] 07/23/2021, 10:56 AM

## 2021-07-23 NOTE — Anesthesia Preprocedure Evaluation (Addendum)
Anesthesia Evaluation  Patient identified by MRN, date of birth, ID band Patient awake    Reviewed: Allergy & Precautions, H&P , NPO status , Patient's Chart, lab work & pertinent test results  Airway Mallampati: II  TM Distance: >3 FB Neck ROM: Full    Dental no notable dental hx.    Pulmonary neg pulmonary ROS,    Pulmonary exam normal breath sounds clear to auscultation       Cardiovascular Exercise Tolerance: Good negative cardio ROS Normal cardiovascular exam Rhythm:Regular Rate:Normal     Neuro/Psych negative neurological ROS  negative psych ROS   GI/Hepatic negative GI ROS, Neg liver ROS,   Endo/Other  negative endocrine ROS  Renal/GU negative Renal ROS  negative genitourinary   Musculoskeletal  (+) Arthritis , Osteoarthritis,    Abdominal   Peds negative pediatric ROS (+)  Hematology  (+) anemia ,   Anesthesia Other Findings   Reproductive/Obstetrics negative OB ROS                            Anesthesia Physical Anesthesia Plan  ASA: 2 and emergent  Anesthesia Plan: General   Post-op Pain Management: Tylenol PO (pre-op)   Induction: Intravenous  PONV Risk Score and Plan: 2 and Treatment may vary due to age or medical condition  Airway Management Planned: Oral ETT  Additional Equipment: None  Intra-op Plan:   Post-operative Plan: Extubation in OR  Informed Consent: I have reviewed the patients History and Physical, chart, labs and discussed the procedure including the risks, benefits and alternatives for the proposed anesthesia with the patient or authorized representative who has indicated his/her understanding and acceptance.     Dental advisory given  Plan Discussed with: CRNA, Anesthesiologist and Surgeon  Anesthesia Plan Comments:         Anesthesia Quick Evaluation

## 2021-07-23 NOTE — Progress Notes (Signed)
Patient continues to decline nocturnal CPAP. Order changed to prn.  

## 2021-07-24 ENCOUNTER — Encounter (HOSPITAL_COMMUNITY): Payer: Self-pay | Admitting: Surgery

## 2021-07-24 LAB — COMPREHENSIVE METABOLIC PANEL
ALT: 142 U/L — ABNORMAL HIGH (ref 0–44)
AST: 101 U/L — ABNORMAL HIGH (ref 15–41)
Albumin: 3.1 g/dL — ABNORMAL LOW (ref 3.5–5.0)
Alkaline Phosphatase: 127 U/L — ABNORMAL HIGH (ref 38–126)
Anion gap: 9 (ref 5–15)
BUN: 9 mg/dL (ref 6–20)
CO2: 27 mmol/L (ref 22–32)
Calcium: 9.3 mg/dL (ref 8.9–10.3)
Chloride: 102 mmol/L (ref 98–111)
Creatinine, Ser: 1.14 mg/dL (ref 0.61–1.24)
GFR, Estimated: >60 mL/min (ref >60)
Glucose, Bld: 116 mg/dL — ABNORMAL HIGH (ref 70–99)
Potassium: 4.3 mmol/L (ref 3.5–5.1)
Sodium: 138 mmol/L (ref 135–145)
Total Bilirubin: 1.7 mg/dL — ABNORMAL HIGH (ref 0.3–1.2)
Total Protein: 7.2 g/dL (ref 6.5–8.1)

## 2021-07-24 LAB — SURGICAL PATHOLOGY

## 2021-07-24 MED ORDER — ENOXAPARIN SODIUM 40 MG/0.4ML IJ SOSY
40.0000 mg | PREFILLED_SYRINGE | INTRAMUSCULAR | Status: DC
  Administered 2021-07-24: 40 mg via SUBCUTANEOUS
  Filled 2021-07-24: qty 0.4

## 2021-07-24 MED ORDER — OXYCODONE HCL 5 MG PO TABS: 5.0000 mg | ORAL_TABLET | Freq: Four times a day (QID) | ORAL | 0 refills | Status: AC | PRN

## 2021-07-24 NOTE — Progress Notes (Addendum)
1 Day Post-Op  Subjective: CC: Doing well. Having some soreness around incisions that is well controlled with medications. Tolerating fld. Had grits for breakfast. No n/v. Mobilizing in the room. Voiding.   Objective: Vital signs in last 24 hours: Temp:  [97.8 F (36.6 C)-99.2 F (37.3 C)] 98 F (36.7 C) (12/13 0549) Pulse Rate:  [59-73] 61 (12/13 0549) Resp:  [9-21] 16 (12/13 0549) BP: (119-159)/(73-99) 131/79 (12/13 0549) SpO2:  [95 %-100 %] 98 % (12/13 0549) Last BM Date: 07/19/21  Intake/Output from previous day: 12/12 0701 - 12/13 0700 In: 1959.6 [P.O.:240; I.V.:1619.6; IV Piggyback:100] Out: 805 [Urine:800; Blood:5] Intake/Output this shift: Total I/O In: 720 [P.O.:720] Out: -   PE: Gen:  Alert, NAD, pleasant, up in chair  Pulm:  Rate and effort normal. On RA.  Abd: Soft, ND, appropriately tender around laparoscopic incisions - otherwise NT. +BS, incisions with glue intact appears well and are without drainage, bleeding, or signs of infection Psych: A&Ox3  Skin: no rashes noted, warm and dry  Lab Results:  No results for input(s): WBC, HGB, HCT, PLT in the last 72 hours. BMET Recent Labs    07/23/21 0319 07/24/21 0329  NA 138 138  K 3.9 4.3  CL 104 102  CO2 26 27  GLUCOSE 102* 116*  BUN 6 9  CREATININE 1.19 1.14  CALCIUM 8.8* 9.3   PT/INR No results for input(s): LABPROT, INR in the last 72 hours. CMP     Component Value Date/Time   NA 138 07/24/2021 0329   K 4.3 07/24/2021 0329   CL 102 07/24/2021 0329   CO2 27 07/24/2021 0329   GLUCOSE 116 (H) 07/24/2021 0329   BUN 9 07/24/2021 0329   CREATININE 1.14 07/24/2021 0329   CALCIUM 9.3 07/24/2021 0329   PROT 7.2 07/24/2021 0329   ALBUMIN 3.1 (L) 07/24/2021 0329   AST 101 (H) 07/24/2021 0329   ALT 142 (H) 07/24/2021 0329   ALKPHOS 127 (H) 07/24/2021 0329   BILITOT 1.7 (H) 07/24/2021 0329   GFRNONAA >60 07/24/2021 0329   Lipase     Component Value Date/Time   LIPASE 47 07/20/2021 0758     Studies/Results: DG Cholangiogram Operative  Result Date: 07/23/2021 CLINICAL DATA:  Cholelithiasis EXAM: INTRAOPERATIVE CHOLANGIOGRAM TECHNIQUE: Cholangiographic images from the C-arm fluoroscopic device were submitted for interpretation post-operatively. Please see the procedural report for the amount of contrast and the fluoroscopy time utilized. COMPARISON:  07/19/2021 FINDINGS: Intraoperative cholangiogram performed during laparoscopic procedure. The cystic duct, biliary confluence, common hepatic duct, and common bile duct are all patent. Contrast easily drains into the duodenum. No dilatation, obstruction, stricture, or large filling defect. IMPRESSION: Patent biliary system. Electronically Signed   By: Judie Petit.  Shick M.D.   On: 07/23/2021 13:53    Anti-infectives: Anti-infectives (From admission, onward)    Start     Dose/Rate Route Frequency Ordered Stop   07/24/21 0600  ceFAZolin (ANCEF) IVPB 2g/100 mL premix        2 g 200 mL/hr over 30 Minutes Intravenous On call to O.R. 07/23/21 1130 07/23/21 1302        Assessment/Plan POD 1 s/p Laparoscopic Cholecystectomy with IOC for Gallstone Pancreatitis - Dr. Luisa Hart - 07/23/2021 - IOC with patent biliary system - T. Bili downtrending - Patient tolerating fld without n/v. Adv diet - Mobilize - Pulm toilet - Patient okay for d/c from our standpoint. We will arrange follow up. Discharge/return precautions discussed. I have sent pain medication to his pharmacy on  file. He understands he is not to drive while taking narcotic pain medication. I have reached out to [facility] to let them know patient is okay for discharge from our standpoint.   FEN - Reg, IVF per primary VTE - SCDs, Lovenox  ID - Ancef periop   LOS: 6 days    Jacinto Halim , [facility] 07/24/2021, 8:59 AM Please see Amion for pager number during day hours 7:00am-4:30pm

## 2021-07-24 NOTE — Plan of Care (Signed)
Plan of care reviewed and discussed with the patient. 

## 2021-07-24 NOTE — Plan of Care (Signed)
°  Problem: Education: Goal: Knowledge of General Education information will improve Description: Including pain rating scale, medication(s)/side effects and non-pharmacologic comfort measures Outcome: Adequate for Discharge   Problem: Health Behavior/Discharge Planning: Goal: Ability to manage health-related needs will improve Outcome: Adequate for Discharge   Problem: Clinical Measurements: Goal: Ability to maintain clinical measurements within normal limits will improve Outcome: Adequate for Discharge Goal: Will remain free from infection Outcome: Adequate for Discharge Goal: Diagnostic test results will improve Outcome: Adequate for Discharge Goal: Respiratory complications will improve Outcome: Adequate for Discharge Goal: Cardiovascular complication will be avoided Outcome: Adequate for Discharge   Problem: Activity: Goal: Risk for activity intolerance will decrease Outcome: Adequate for Discharge   Problem: Nutrition: Goal: Adequate nutrition will be maintained Outcome: Adequate for Discharge   Problem: Coping: Goal: Level of anxiety will decrease Outcome: Adequate for Discharge   Problem: Elimination: Goal: Will not experience complications related to bowel motility Outcome: Adequate for Discharge   Problem: Pain Managment: Goal: General experience of comfort will improve Outcome: Adequate for Discharge   Problem: Safety: Goal: Ability to remain free from injury will improve Outcome: Adequate for Discharge   Problem: Skin Integrity: Goal: Risk for impaired skin integrity will decrease Outcome: Adequate for Discharge   Problem: Clinical Measurements: Goal: Postoperative complications will be avoided or minimized Outcome: Adequate for Discharge

## 2021-07-24 NOTE — Discharge Summary (Signed)
Physician Discharge Summary   Patient name: Jason Dougherty  Admit date:     07/18/2021  Discharge date: 07/24/2021  Discharge Physician: Brendia Sacks   PCP: [facility]   Recommendations at discharge:  Follow-up cholecystectomy for gallstone pancreatitis Follow-up elevated LFTs, see below Follow-up thrombocytopenia, see below  Discharge Diagnoses Principal Problem:   Acute gallstone pancreatitis Active Problems:   Cholelithiasis   Thrombocytopenia (HCC)   Abnormal LFTs  Hospital Course   50 year old man presented with intense epigastric abdominal pain. --12/7 admitted for acute biliary pancreatitis. --12/8 MRCP no choledocholithiasis. --12/11 patient considering cholecystectomy. --12/12 stable, lap chole w/ IOC   * Acute gallstone pancreatitis -- Clinically resolved.  Tolerating diet.  No nausea or vomiting.  Status postcholecystectomy 12/12. -- Total bilirubin slowly trending down.  Transaminases without significant change.  Cholelithiasis -- Symptomatic.  Status postcholecystectomy.  Abnormal LFTs -- Secondary to gallstone pancreatitis.  MRCP unrevealing.  Total bilirubin trending down.  Transaminases without significant change.  Follow-up as an outpatient to resolution.  Thrombocytopenia (HCC) -- Minimal, probably secondary to acute illness, follow-up as an outpatient  Procedures performed: Laparoscopic cholecystectomy, IOC  Condition at discharge: good  Feels good Exam Physical Exam Constitutional:      General: He is not in acute distress.    Appearance: He is not ill-appearing or toxic-appearing.  Cardiovascular:     Rate and Rhythm: Normal rate and regular rhythm.     Heart sounds: No murmur heard. Pulmonary:     Effort: Pulmonary effort is normal. No respiratory distress.     Breath sounds: No wheezing, rhonchi or rales.  Abdominal:     Palpations: Abdomen is soft.  Neurological:     Mental Status: He is alert.  Psychiatric:         Mood and Affect: Mood normal.        Behavior: Behavior normal.    Disposition: Home  Discharge time: less than 30 minutes.  Follow-up Information     [facility] Follow up on 08/14/2021.   Specialty: General Surgery Why: 08/14/21 at 9:15. Please arrive 30 minutes prior to your appointment for paperwork. Please bring a copy of your photo ID and insurance card. Contact information: [address] [place] [address] [phone]                 Allergies as of 07/24/2021       Reactions   Duloxetine Other (See Comments)   Nosebleed, cognitive changes   Mirabegron Other (See Comments)        Medication List     TAKE these medications    diltiazem 2 % Gel 1 application daily as needed (for fissures).   esomeprazole 40 MG capsule Commonly known as: NEXIUM Take 40 mg by mouth daily as needed (for acid reflux).   fexofenadine 180 MG tablet Commonly known as: ALLEGRA Take 180 mg by mouth daily as needed for allergies.   oxyCODONE 5 MG immediate release tablet Commonly known as: Oxy IR/ROXICODONE Take 1 tablet (5 mg total) by mouth every 6 (six) hours as needed for breakthrough pain.   PRESCRIPTION MEDICATION Inhale into the lungs See admin instructions. CPAP- at bedtime   propranolol 10 MG tablet Commonly known as: INDERAL Take 10 mg by mouth daily as needed (for headache/when feels like BP may be high).   sildenafil 100 MG tablet Commonly known as: VIAGRA Take 100 mg by mouth as needed for erectile dysfunction.  Discharge Care Instructions  (From admission, onward)           Start     Ordered   07/24/21 0000  Discharge wound care:       Comments: As per surgery team.   07/24/21 1048            DG Cholangiogram Operative  Result Date: 07/23/2021 CLINICAL DATA:  Cholelithiasis EXAM: INTRAOPERATIVE CHOLANGIOGRAM TECHNIQUE: Cholangiographic images from the C-arm fluoroscopic device were submitted  for interpretation post-operatively. Please see the procedural report for the amount of contrast and the fluoroscopy time utilized. COMPARISON:  07/19/2021 FINDINGS: Intraoperative cholangiogram performed during laparoscopic procedure. The cystic duct, biliary confluence, common hepatic duct, and common bile duct are all patent. Contrast easily drains into the duodenum. No dilatation, obstruction, stricture, or large filling defect. IMPRESSION: Patent biliary system. Electronically Signed   By: Judie Petit.  Shick M.D.   On: 07/23/2021 13:53   CT ABDOMEN PELVIS W CONTRAST  Result Date: 07/18/2021 CLINICAL DATA:  Pancreatitis suspected.  Abdominal pain EXAM: CT ABDOMEN AND PELVIS WITH CONTRAST TECHNIQUE: Multidetector CT imaging of the abdomen and pelvis was performed using the standard protocol following bolus administration of intravenous contrast. CONTRAST:  13mL OMNIPAQUE IOHEXOL 350 MG/ML SOLN COMPARISON:  Outside exam dated 07/18/2017 FINDINGS: Lower chest: Unremarkable. Hepatobiliary: No suspicious focal abnormality within the liver parenchyma. Possible trace pericholecystic fluid. No evidence for calcified gallstones. No intrahepatic or extrahepatic biliary dilation. Pancreas: No main pancreatic duct dilatation. Pancreatic tail appears edematous with peripancreatic edema. Pancreatic parenchyma enhances throughout. Spleen: No splenomegaly. No focal mass lesion. Adrenals/Urinary Tract: No adrenal nodule or mass. Kidneys unremarkable. No evidence for hydroureter. The urinary bladder appears normal for the degree of distention. Stomach/Bowel: Moderate distention of the stomach with fluid. There is edema around the second and third portions of the duodenum. No small bowel wall thickening. No small bowel dilatation. The terminal ileum is normal. The appendix is normal. No gross colonic mass. No colonic wall thickening. Vascular/Lymphatic: No abdominal aortic aneurysm There is no gastrohepatic or hepatoduodenal ligament  lymphadenopathy. No retroperitoneal or mesenteric lymphadenopathy. No pelvic sidewall lymphadenopathy. Reproductive: The prostate gland and seminal vesicles are unremarkable. Other: Retroperitoneal edema in the abdomen tracks down towards the pelvis. Trace perihepatic fluid evident. Musculoskeletal: No worrisome lytic or sclerotic osseous abnormality. Degenerative changes noted at the L5-S1 disc. IMPRESSION: 1. Retroperitoneal edema with edema/inflammation around the pancreatic tail and second and third portions of the duodenum. Imaging features could be compatible with acute pancreatitis. No evidence for pancreatic necrosis. No main pancreatic duct dilatation. Duodenitis would also be a consideration. 2. Possible trace pericholecystic fluid. No evidence for calcified gallstones. If there is clinical concern for acute cholecystitis, abdominal ultrasound may prove helpful to further evaluate. 3. Trace perihepatic fluid. Electronically Signed   By: Kennith Center M.D.   On: 07/18/2021 13:35   MR 3D Recon At Scanner  Result Date: 07/19/2021 CLINICAL DATA:  Elevated liver function tests in a 50 year old male with suspected pancreatitis. EXAM: MRI ABDOMEN WITHOUT AND WITH CONTRAST (INCLUDING MRCP) TECHNIQUE: Multiplanar multisequence MR imaging of the abdomen was performed both before and after the administration of intravenous contrast. Heavily T2-weighted images of the biliary and pancreatic ducts were obtained, and three-dimensional MRCP images were rendered by post processing. CONTRAST:  35mL GADAVIST GADOBUTROL 1 MMOL/ML IV SOLN COMPARISON:  Comparison is made with CT evaluation from July 18, 2021. FINDINGS: Lower chest: Small bilateral pleural effusions and basilar atelectasis, not well assessed. The effusions are new since previous  CT imaging. Is Hepatobiliary: Signs of extensive sludge in the gallbladder lumen. Large gallstone in the gallbladder fundus. This measures 11-12 mm. Gallbladder is nondistended.  There is mild hyperenhancement about the fundus of the gallbladder adjacent to the large gallstone which is circumferential and uniform. The common bile duct is normal caliber. Normal biliary anatomy. No filling defect in the common bile duct. Trace pericholecystic fluid but generalized edema and small volume ascites is present in the abdomen. Liver contour is smooth. No signs of suspicious focal hepatic lesion. Ascites with slightly increased volume since previous imaging, seen about the liver on the prior study but suggested in the lower abdomen and upper pelvis on the current exam. Pancreas: Peripancreatic edema. Suspect small developing acute pancreatic collection about the tail of the pancreas along the lesser curvature of the stomach measuring approximately 2.5 cm greatest dimension. T1 signal in the pancreatic parenchyma is preserved. Enhancement is present throughout the gland. Splenic vein is patent. No pancreatic ductal dilation. Extensive stranding about the pancreas and generalized stranding in the anterior pararenal space. Spleen:  Normal spleen. Adrenals/Urinary Tract: Adrenal glands are normal. Symmetric renal enhancement. No hydronephrosis. No perinephric stranding. Stomach/Bowel: Mild edema of the small bowel in the area of the duodenum. No acute gastrointestinal process to the extent evaluated on abdominal MR otherwise. Vascular/Lymphatic: No pathologically enlarged lymph nodes identified. No abdominal aortic aneurysm demonstrated. Other: Ascites slightly increased in volume now in the abdomen and pelvis, extending in the low pelvis previously isolated to perihepatic changes. Musculoskeletal: No suspicious bone lesions identified. IMPRESSION: Signs of acute pancreatitis with small developing acute pancreatic collection about the tail of the pancreas along the lesser curvature of the stomach, along with generalized edema in the retroperitoneum and slight increase in abdominal ascites. No signs of  choledocholithiasis. Gallbladder wall with mild thickening and mild hyperenhancement but without distension. Sludge and stones in the gallbladder lumen. Findings could be secondary inflammation from generalized inflammation related to pancreatitis. If there is continued concern for cholecystitis HIDA scan could potentially add specificity. Small bilateral pleural effusions and basilar atelectasis, new since previous CT imaging. Electronically Signed   By: Donzetta Kohut M.D.   On: 07/19/2021 19:06   MR ABDOMEN MRCP W WO CONTAST  Result Date: 07/19/2021 CLINICAL DATA:  Elevated liver function tests in a 50 year old male with suspected pancreatitis. EXAM: MRI ABDOMEN WITHOUT AND WITH CONTRAST (INCLUDING MRCP) TECHNIQUE: Multiplanar multisequence MR imaging of the abdomen was performed both before and after the administration of intravenous contrast. Heavily T2-weighted images of the biliary and pancreatic ducts were obtained, and three-dimensional MRCP images were rendered by post processing. CONTRAST:  8mL GADAVIST GADOBUTROL 1 MMOL/ML IV SOLN COMPARISON:  Comparison is made with CT evaluation from July 18, 2021. FINDINGS: Lower chest: Small bilateral pleural effusions and basilar atelectasis, not well assessed. The effusions are new since previous CT imaging. Is Hepatobiliary: Signs of extensive sludge in the gallbladder lumen. Large gallstone in the gallbladder fundus. This measures 11-12 mm. Gallbladder is nondistended. There is mild hyperenhancement about the fundus of the gallbladder adjacent to the large gallstone which is circumferential and uniform. The common bile duct is normal caliber. Normal biliary anatomy. No filling defect in the common bile duct. Trace pericholecystic fluid but generalized edema and small volume ascites is present in the abdomen. Liver contour is smooth. No signs of suspicious focal hepatic lesion. Ascites with slightly increased volume since previous imaging, seen about the  liver on the prior study but suggested in the lower  abdomen and upper pelvis on the current exam. Pancreas: Peripancreatic edema. Suspect small developing acute pancreatic collection about the tail of the pancreas along the lesser curvature of the stomach measuring approximately 2.5 cm greatest dimension. T1 signal in the pancreatic parenchyma is preserved. Enhancement is present throughout the gland. Splenic vein is patent. No pancreatic ductal dilation. Extensive stranding about the pancreas and generalized stranding in the anterior pararenal space. Spleen:  Normal spleen. Adrenals/Urinary Tract: Adrenal glands are normal. Symmetric renal enhancement. No hydronephrosis. No perinephric stranding. Stomach/Bowel: Mild edema of the small bowel in the area of the duodenum. No acute gastrointestinal process to the extent evaluated on abdominal MR otherwise. Vascular/Lymphatic: No pathologically enlarged lymph nodes identified. No abdominal aortic aneurysm demonstrated. Other: Ascites slightly increased in volume now in the abdomen and pelvis, extending in the low pelvis previously isolated to perihepatic changes. Musculoskeletal: No suspicious bone lesions identified. IMPRESSION: Signs of acute pancreatitis with small developing acute pancreatic collection about the tail of the pancreas along the lesser curvature of the stomach, along with generalized edema in the retroperitoneum and slight increase in abdominal ascites. No signs of choledocholithiasis. Gallbladder wall with mild thickening and mild hyperenhancement but without distension. Sludge and stones in the gallbladder lumen. Findings could be secondary inflammation from generalized inflammation related to pancreatitis. If there is continued concern for cholecystitis HIDA scan could potentially add specificity. Small bilateral pleural effusions and basilar atelectasis, new since previous CT imaging. Electronically Signed   By: Donzetta Kohut M.D.   On: 07/19/2021  19:06   US Abdomen Limited RUQ (LIVER/GB)  Result Date: 07/18/2021 CLINICAL DATA:  Acute pancreatitis, elevated LFTs EXAM: ULTRASOUND ABDOMEN LIMITED RIGHT UPPER QUADRANT COMPARISON:  07/18/2021 FINDINGS: Gallbladder: Multiple echogenic shadowing gallstones noted, largest measuring 1.7 cm. Proximal gallbladder wall is thickened to 4.6 mm anteriorly. Despite this, no sonographic Murphy's sign elicited or pericholecystic fluid. Common bile duct: Diameter: 3 mm. Liver: No focal lesion identified. Within normal limits in parenchymal echogenicity. Portal vein is patent on color Doppler imaging with normal direction of blood flow towards the liver. Other: Trace perihepatic free fluid. IMPRESSION: Cholelithiasis with gallbladder wall thickening but no associated Murphy's sign or pericholecystic fluid. Findings remain nonspecific. Trace perihepatic ascites Electronically Signed   By: Judie Petit.  Shick M.D.   On: 07/18/2021 14:42   Results for orders placed or performed during the hospital encounter of 07/18/21  Resp Panel by RT-PCR (Flu A&B, Covid) Nasopharyngeal Swab     Status: None   Collection Time: 07/18/21 12:08 PM   Specimen: Nasopharyngeal Swab; Nasopharyngeal(NP) swabs in vial transport medium  Result Value Ref Range Status   SARS Coronavirus 2 by RT PCR NEGATIVE NEGATIVE Final    Comment: (NOTE) SARS-CoV-2 target nucleic acids are NOT DETECTED.  The SARS-CoV-2 RNA is generally detectable in upper respiratory specimens during the acute phase of infection. The lowest concentration of SARS-CoV-2 viral copies this assay can detect is 138 copies/mL. A negative result does not preclude SARS-Cov-2 infection and should not be used as the sole basis for treatment or other patient management decisions. A negative result may occur with  improper specimen collection/handling, submission of specimen other than nasopharyngeal swab, presence of viral mutation(s) within the areas targeted by this assay, and  inadequate number of viral copies(<138 copies/mL). A negative result must be combined with clinical observations, patient history, and epidemiological information. The expected result is Negative.  Fact Sheet for Patients:  BloggerCourse.com  Fact Sheet for Healthcare Providers:  SeriousBroker.it  This test  is no t yet approved or cleared by the [place] and  has been authorized for detection and/or diagnosis of SARS-CoV-2 by FDA under an Emergency Use Authorization (EUA). This EUA will remain  in effect (meaning this test can be used) for the duration of the COVID-19 declaration under Section 564(b)(1) of the Act, 21 U.S.C.section 360bbb-3(b)(1), unless the authorization is terminated  or revoked sooner.       Influenza A by PCR NEGATIVE NEGATIVE Final   Influenza B by PCR NEGATIVE NEGATIVE Final    Comment: (NOTE) The Xpert Xpress SARS-CoV-2/FLU/RSV plus assay is intended as an aid in the diagnosis of influenza from Nasopharyngeal swab specimens and should not be used as a sole basis for treatment. Nasal washings and aspirates are unacceptable for Xpert Xpress SARS-CoV-2/FLU/RSV testing.  Fact Sheet for Patients: BloggerCourse.com  Fact Sheet for Healthcare Providers: SeriousBroker.it  This test is not yet approved or cleared by the [place] FDA and has been authorized for detection and/or diagnosis of SARS-CoV-2 by FDA under an Emergency Use Authorization (EUA). This EUA will remain in effect (meaning this test can be used) for the duration of the COVID-19 declaration under Section 564(b)(1) of the Act, 21 U.S.C. section 360bbb-3(b)(1), unless the authorization is terminated or revoked.  Performed at [facility], [address] [place], [place] [address]   Surgical pcr screen     Status: None   Collection Time: 07/23/21 11:31 AM    Specimen: Nasal Mucosa; Nasal Swab  Result Value Ref Range Status   MRSA, PCR NEGATIVE NEGATIVE Final   Staphylococcus aureus NEGATIVE NEGATIVE Final    Comment: (NOTE) The Xpert SA Assay (FDA approved for NASAL specimens in patients 35 years of age and older), is one component of a comprehensive surveillance program. It is not intended to diagnose infection nor to guide or monitor treatment. Performed at [facility], [address] [place], [place] [address]     Signed:  Brendia Sacks MD.  [facility] 07/24/2021, 11:22 AM

## 2021-07-24 NOTE — Progress Notes (Signed)
°  Transition of Care (TOC) Screening Note   Patient Details  Name: Jason Dougherty Date of Birth: [birth year]   Transition of Care (TOC) CM/SW Contact:    Amada Jupiter, LCSW Phone Number: 07/24/2021, 10:10 AM    Transition of Care Department [facility]) has reviewed patient and no TOC needs have been identified at this time. We will continue to monitor patient advancement through interdisciplinary progression rounds. If new patient transition needs arise, please place a TOC consult.    Victoire Deans, LCSW

## 2023-07-09 IMAGING — RF DG CHOLANGIOGRAM OPERATIVE
1 series · 4 of 4 positions shown · non-contrast
Comparison: 07/19/2021

CLINICAL DATA: Cholelithiasis

EXAM:
INTRAOPERATIVE CHOLANGIOGRAM
TECHNIQUE: Cholangiographic images from the C-arm fluoroscopic device were
submitted for interpretation post-operatively. Please see the
procedural report for the amount of contrast and the fluoroscopy
time utilized.

[Series 1: run · 4 of 55 frames shown]
[frame 9/55]
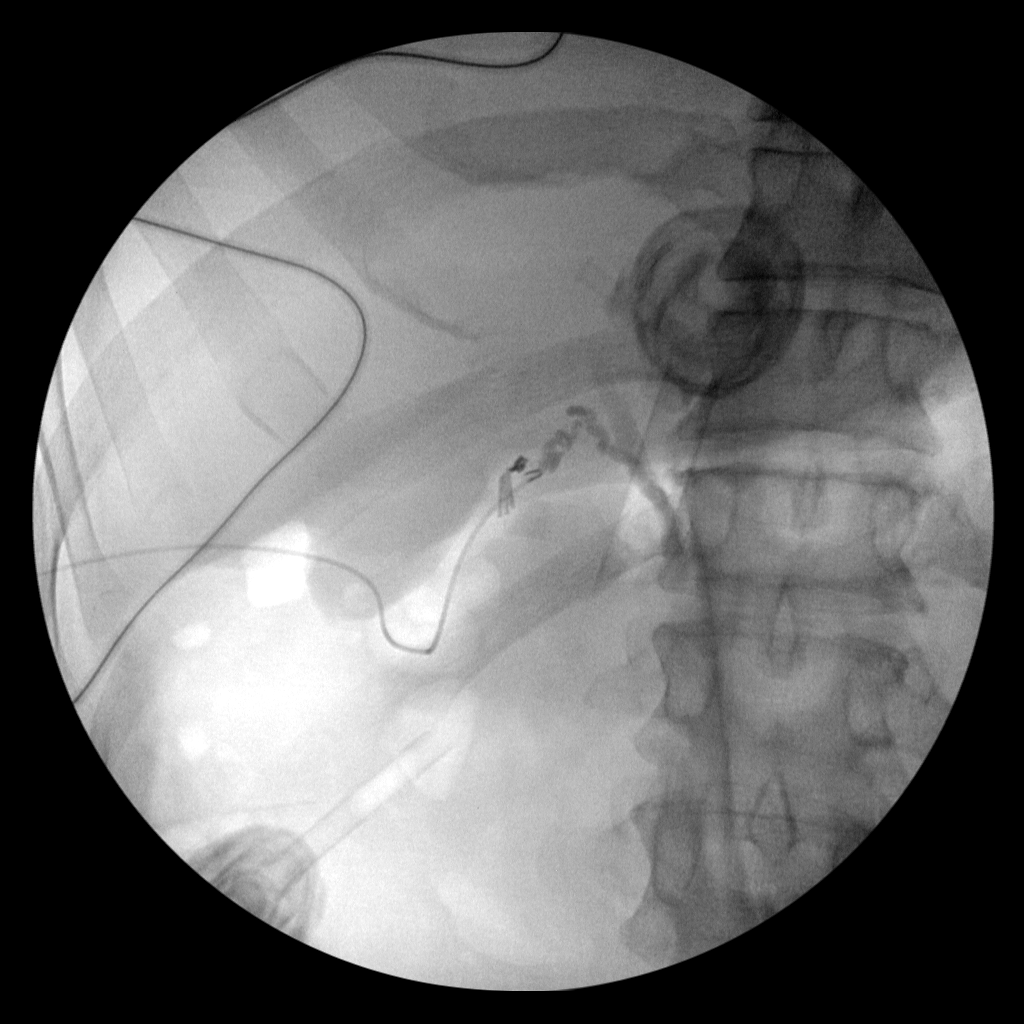
[frame 28/55]
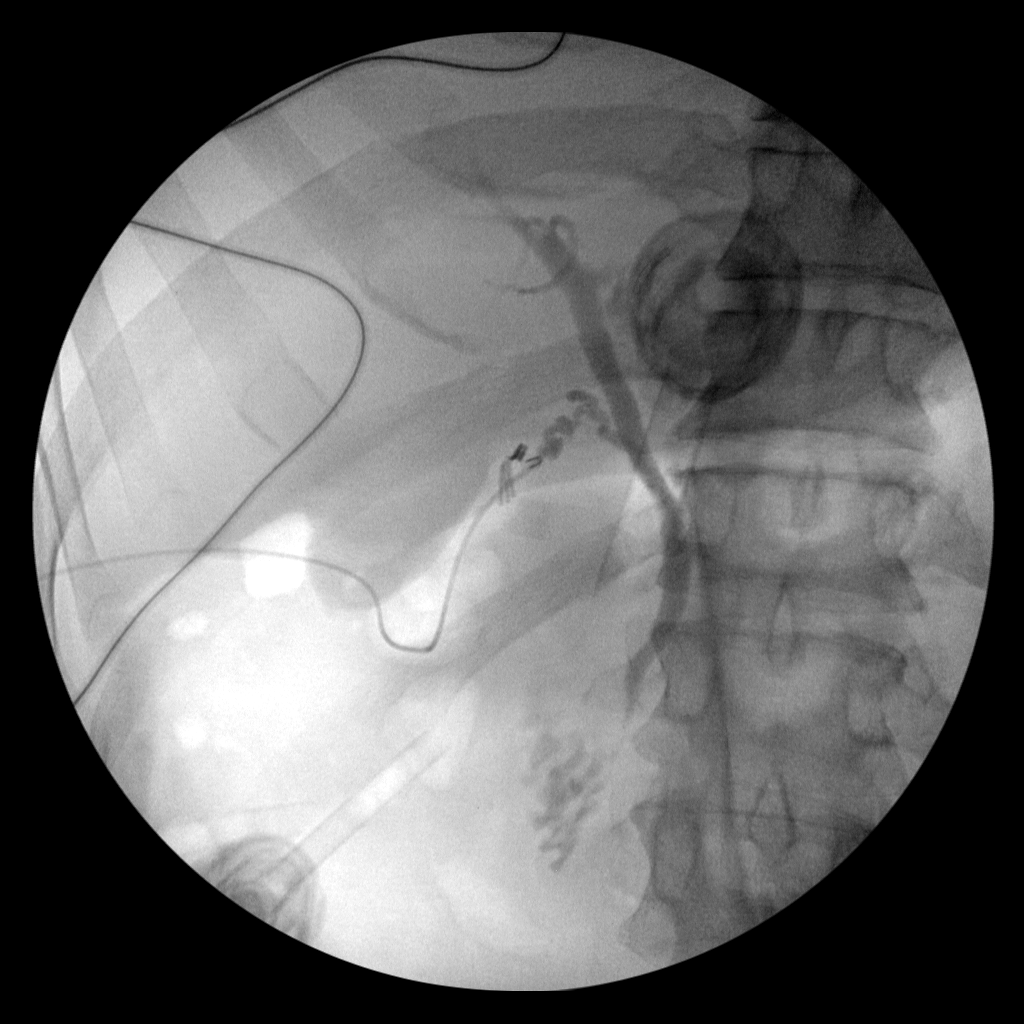
[frame 39/55]
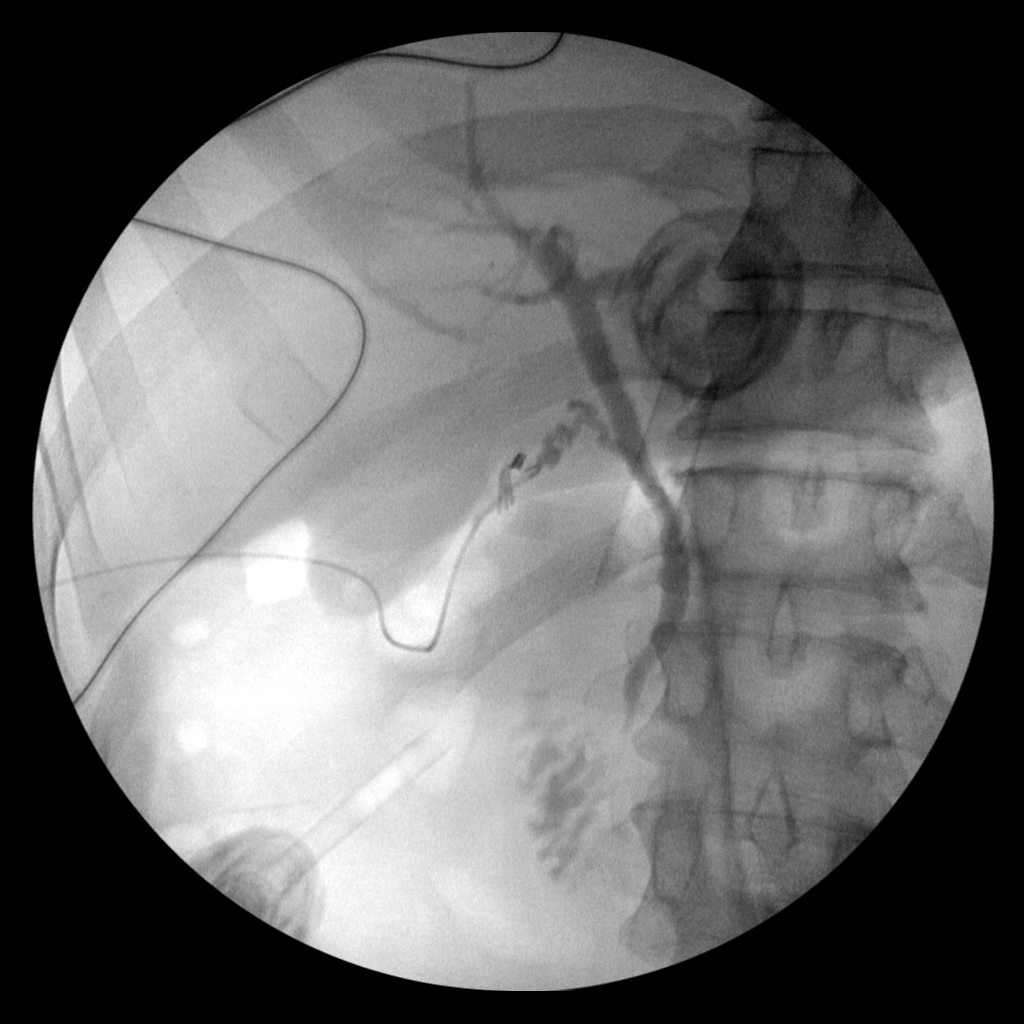
[frame 47/55]
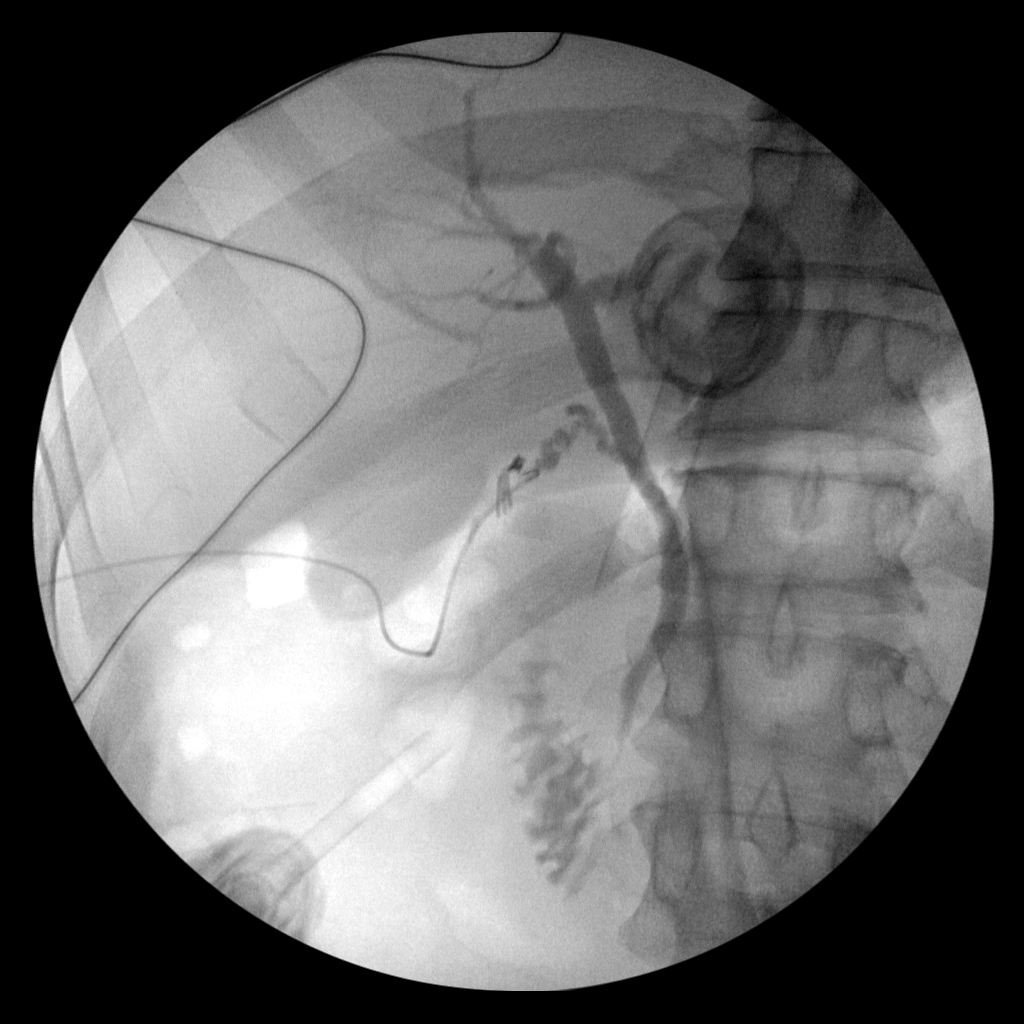

[4 of 4 positions shown; findings below may reference images not displayed]

FINDINGS: Intraoperative cholangiogram performed during laparoscopic
procedure. The cystic duct, biliary confluence, common hepatic duct,
and common bile duct are all patent. Contrast easily drains into the
duodenum. No dilatation, obstruction, stricture, or large filling
defect.
IMPRESSION: Patent biliary system.

## 2024-03-15 ENCOUNTER — Encounter: Payer: Self-pay | Admitting: Orthopedic Surgery

## 2024-03-15 ENCOUNTER — Encounter (HOSPITAL_COMMUNITY): Payer: Self-pay | Admitting: Orthopedic Surgery

## 2024-03-15 ENCOUNTER — Encounter: Payer: Self-pay | Admitting: Radiology

## 2024-03-15 ENCOUNTER — Other Ambulatory Visit (HOSPITAL_COMMUNITY): Payer: Self-pay | Admitting: Orthopedic Surgery

## 2024-03-15 DIAGNOSIS — R2232 Localized swelling, mass and lump, left upper limb: Secondary | ICD-10-CM

## 2024-03-15 NOTE — Progress Notes (Signed)
 [name], MD  Jason Dougherty, RT Ok for US  aspiration no sedation Prob ganglion cyst  [facility]       Previous Messages    ----- Message ----- From: Jason Dougherty, RT Sent: 03/15/2024  11:03 AM EDT To: Ir Procedure Requests Subject: imaging guided aspiration of left dorsal wri*  Procedure : imaging guided aspiration of left dorsal wrist mass  Reason : left dorsal wrist mass  History : no history of imaging of wrist with us  - per [facility] this is not for biopsy  Provider: [name]  Provider contact ; [phone] ext [phone]  We do not know what this is supposed to be put in as. If you could provide some clarification on this that would great.

## 2024-04-05 ENCOUNTER — Other Ambulatory Visit (HOSPITAL_COMMUNITY): Payer: Self-pay | Admitting: Orthopedic Surgery

## 2024-04-05 ENCOUNTER — Ambulatory Visit (HOSPITAL_COMMUNITY)
Admission: RE | Admit: 2024-04-05 | Discharge: 2024-04-05 | Disposition: A | Discharge status: Home / Self Care | Source: Ambulatory Visit | Attending: Orthopedic Surgery | Admitting: Orthopedic Surgery

## 2024-04-05 DIAGNOSIS — R2232 Localized swelling, mass and lump, left upper limb: Secondary | ICD-10-CM | POA: Insufficient documentation

## 2024-04-05 MED ORDER — LIDOCAINE HCL 1 % IJ SOLN
INTRAMUSCULAR | Status: AC
  Filled 2024-04-05: qty 20

## 2024-04-22 ENCOUNTER — Ambulatory Visit: Attending: Orthopedic Surgery | Admitting: Occupational Therapy

## 2024-04-22 DIAGNOSIS — R278 Other lack of coordination: Secondary | ICD-10-CM | POA: Insufficient documentation

## 2024-04-22 DIAGNOSIS — M6281 Muscle weakness (generalized): Secondary | ICD-10-CM | POA: Diagnosis present

## 2024-04-22 DIAGNOSIS — R600 Localized edema: Secondary | ICD-10-CM | POA: Diagnosis present

## 2024-04-22 DIAGNOSIS — M25641 Stiffness of right hand, not elsewhere classified: Secondary | ICD-10-CM | POA: Insufficient documentation

## 2024-04-22 DIAGNOSIS — M79644 Pain in right finger(s): Secondary | ICD-10-CM | POA: Insufficient documentation

## 2024-04-22 NOTE — Patient Instructions (Signed)
 Splint wear and care:   Wear finger splint at all times except to take off once a day to clean splint. You must keep tip joint straight while cleaning finger and splint, and air dry finger 5-10 minutes (keep tip joint of finger pressed against table to keep straight).   This will need to be worn for at least 6 weeks.   DO BELOW EXERCISES WITH SPLINT ON:   AROM: PIP Flexion / Extension    Pinch bottom knuckle of ___Rt index____ finger of right hand to prevent bending. Actively bend middle knuckle until stretch is felt. Hold _5___ seconds. Relax. Straighten finger as far as possible. Repeat _10___ times per set.  Do _4__ sessions per day. SPLINT ON for tip joint   MP Flexion (Active)    Bend large knuckles as far as they will go, keeping small joints straight. Repeat __10__ times. Do __4_ sessions per day.  SPLINT ON for tip joint

## 2024-04-22 NOTE — Therapy (Signed)
 OUTPATIENT OCCUPATIONAL THERAPY ORTHO EVALUATION  Patient Name: Jason Dougherty MRN: [id number] DOB:March 22, 1971, 53 y.o., male Today's Date: 04/22/2024  [facility]: [facility] REFERRING PROVIDER: Dr. Marsa Christen  END OF SESSION:  OT End of Session - 04/22/24 1230     Visit Number 1    Number of Visits 6    Date for OT Re-Evaluation 06/03/24    Authorization Type [facility] Auth # [id number]    Authorization Time Period 04/09/24 - 08/07/24    Authorization - Visit Number 1    Authorization - Number of Visits 15    OT Start Time 0925    OT Stop Time 1015    OT Time Calculation (min) 50 min    Activity Tolerance Patient tolerated treatment well    Behavior During Therapy WFL for tasks assessed/performed          Past Medical History:  Diagnosis Date   Allergic rhinitis    Arthritis    Bone spur    Past Surgical History:  Procedure Laterality Date   BACK SURGERY     CHOLECYSTECTOMY N/A 07/23/2021   Procedure: LAPAROSCOPIC CHOLECYSTECTOMY WITH INTRAOPERATIVE CHOLANGIOGRAM;  Surgeon: Vanderbilt Ned, MD;  Location: [facility];  Service: General;  Laterality: N/A;   HAND SURGERY     Patient Active Problem List   Diagnosis Date Noted   Thrombocytopenia (HCC) 07/22/2021   Abnormal LFTs 07/22/2021   Cholelithiasis 07/22/2021   Acute gallstone pancreatitis 07/18/2021    ONSET DATE: 04/15/24  REFERRING DIAG: Mallet finger  (MD order calls for serial casting)   THERAPY DIAG:  Stiffness of joint, hand, right  Pain in right finger(s)  Other lack of coordination  Muscle weakness (generalized)  Localized edema  Rationale for Evaluation and Treatment: Rehabilitation  SUBJECTIVE:   SUBJECTIVE STATEMENT: So will I continue with this splint if I get a cast?  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 Arthritis, bone spur  PRECAUTIONS: Other: per mallet finger protocol   WEIGHT BEARING RESTRICTIONS: No  PAIN:   Are you having pain? Chronic back pain, no pain Rt index finger unless hitting it  FALLS: Has patient fallen in last 6 months? Yes. Number of falls 1  LIVING ENVIRONMENT: Lives with: lives alone Has following equipment at home: Single point cane  PLOF: Independent and retired  PATIENT GOALS: get my finger better  NEXT MD VISIT: a couple months ?   OBJECTIVE:  Note: Objective measures were completed at Evaluation unless otherwise noted.  HAND DOMINANCE: Right  ADLs: WFL  FUNCTIONAL OUTCOME MEASURES: Quick Dash: 41% deficit RUE  UPPER EXTREMITY ROM:   BUE AROM WFL's except Rt index finger - see below   Active ROM Right eval Left eval  Thumb MCP (0-60)    Thumb IP (0-80)    Thumb Radial abd/add (0-55)     Thumb Palmar abd/add (0-45)     Thumb Opposition to Small Finger     Index MCP (0-90) 80*    Index PIP (0-100) 40*    Index DIP (0-70) -15*     Long MCP (0-90)      Long PIP (0-100)      Long DIP (0-70)      Ring MCP (0-90)      Ring PIP (0-100)      Ring DIP (0-70)      Little MCP (0-90)      Little PIP (0-100)      Little  DIP (0-70)      (Blank rows = not tested)    HAND FUNCTION: Not tested  COORDINATION: Pt currently using other fingers to pick up small items  SENSATION: Not tested  EDEMA: TBA  COGNITION: Overall cognitive status: Pt demo difficulty following directions and needs multiple cues/review for carryover. Impaired memory  OBSERVATIONS: Pt arrived w/ pre-fab mallet finger splint however it was incorrect size - too big and reports he has been wearing for 3 weeks continuously    TREATMENT DATE: 04/22/24                                                                                                                             This clinic does not do serial casting (requested by MD), therefore fabricated and fitted well molded finger splint keeping DIP joint of Rt index finger in slight hyperextension for chronic mallet finger. Pt  educated in wear and care and importance of maintaining joint in full extension for 6 weeks - even if removing for hygiene purposes. Pt shown how to don/doff keeping joint in ext while cleaning tip of finger and to allow air flow.   Pt also issued pre-fab mallet finger splint (correct size) and shown how to wrap w/ coban for showering purposes. Pt instructed to then remove after showering/drying off while keeping tip joint in full extension and completely drying finger before replacing custom made splint.   Pt shown MP and PIP flex/ext ex's to do while in splint - see pt instructions for details. (Splint may need to be cut down to allow more PIP flexion, but will not be stable if cut down too much)   Informed pt that he will transfer to [facility] clinic for serial casting prn and follow up visits  Pt needed reinforcement and review t/o session and had difficulty following directions for ROM assessment  - ? Mild cognitive/memory deficits    PATIENT EDUCATION: Education details: see above Person educated: Patient Education method: Explanation, Demonstration, Tactile cues, Verbal cues, and Handouts Education comprehension: verbalized understanding, returned demonstration, verbal cues required, and needs further education  HOME EXERCISE PROGRAM: 04/22/24: splint wear and care, initial HEP (MP flex, PIP flex in splint)   GOALS: Goals reviewed with patient? Yes  SHORT TERM GOALS: Target date: 05/11/24  Independent w/ splint wear and care Baseline: Goal status: IN PROGRESS  2.  Independent with initial HEP  Baseline:  Goal status: IN PROGRESS  3.  Pt to increase PIP flexion to 65* or greater Baseline: 40* Goal status: INITIAL   LONG TERM GOALS: Target date: 06/03/24  Independent with updated HEP for DIP ROM Baseline:  Goal status: INITIAL  2.  Pt to improve PIP flexion to 90* or greater for functional tasks Baseline:  Goal status: INITIAL  3.  Pt to return to using index  finger for pincer grasp/picking up smaller objects Baseline:  Goal status: INITIAL  4.  Grip strength goal TBD Baseline:  Goal  status: INITIAL   ASSESSMENT:  CLINICAL IMPRESSION: Patient is a 53 y.o. 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 No surgery. Patient currently presents below baseline level of functioning demonstrating functional deficits and impairments as noted below. Pt will be transferred to [facility] clinic for potential serial casting as needed, and for further progression towards goals.    PERFORMANCE DEFICITS: in functional skills including IADLs, coordination, dexterity, sensation, edema, ROM, strength, pain, Fine motor control, decreased knowledge of precautions, decreased knowledge of use of DME, and UE functional use, cognitive skills including memory.   IMPAIRMENTS: are limiting patient from IADLs, leisure, and social participation.   COMORBIDITIES: may have co-morbidities  that affects occupational performance. Patient will benefit from skilled OT to address above impairments and improve overall function.  MODIFICATION OR ASSISTANCE TO COMPLETE EVALUATION: Min-Moderate modification of tasks or assist with assess necessary to complete an evaluation.  OT OCCUPATIONAL PROFILE AND HISTORY: Problem focused assessment: Including review of records relating to presenting problem.  CLINICAL DECISION MAKING: Moderate - several treatment options, min-mod task modification necessary  REHAB POTENTIAL: Good  EVALUATION COMPLEXITY: Low      PLAN:  OT FREQUENCY: 1x/week  OT DURATION: 6 weeks  PLANNED INTERVENTIONS: 97535 self care/ADL training, 02889 therapeutic exercise, 97530 therapeutic activity, 97140 manual therapy, 97035 ultrasound, 97018 paraffin, 02960 fluidotherapy, 97010 moist heat, 97010 cryotherapy, 97034 contrast bath, 97760 Orthotic Initial, 97763 Orthotic/Prosthetic subsequent,  passive range of motion, patient/family education, and DME and/or AE instructions  RECOMMENDED OTHER SERVICES: none at this time  CONSULTED AND AGREED WITH PLAN OF CARE: Patient  PLAN FOR NEXT SESSION: TBD by [facility] at [facility]   Burnard JINNY Roads, OT 04/22/2024, 12:31 PM

## 2024-04-22 NOTE — Therapy (Addendum)
 OUTPATIENT OCCUPATIONAL THERAPY TREATMENT NOTE   Patient Name: Jason Dougherty MRN: [id number] DOB:1971-02-13, 53 y.o., male Today's Date: 04/26/2024  [facility] REFERRING PROVIDER: Dr. Marsa Christen  END OF SESSION:  OT End of Session - 04/26/24 1354     Visit Number 2    Number of Visits 6    Date for OT Re-Evaluation 06/03/24    Authorization Type [facility] Auth # [id number]    Authorization Time Period 04/09/24 - 08/07/24    Authorization - Number of Visits 15    OT Stop Time 1439    Activity Tolerance Patient tolerated treatment well    Behavior During Therapy WFL for tasks assessed/performed           Past Medical History:  Diagnosis Date   Allergic rhinitis    Arthritis    Bone spur    Past Surgical History:  Procedure Laterality Date   BACK SURGERY     CHOLECYSTECTOMY N/A 07/23/2021   Procedure: LAPAROSCOPIC CHOLECYSTECTOMY WITH INTRAOPERATIVE CHOLANGIOGRAM;  Surgeon: Vanderbilt Ned, MD;  Location: [facility];  Service: General;  Laterality: N/A;   HAND SURGERY     Patient Active Problem List   Diagnosis Date Noted   Thrombocytopenia (HCC) 07/22/2021   Abnormal LFTs 07/22/2021   Cholelithiasis 07/22/2021   Acute gallstone pancreatitis 07/18/2021    ONSET DATE: chronic injury 30+ years ago   REFERRING DIAG: Mallet finger  M20.011  (MD order calls for serial casting)   THERAPY DIAG:  Stiffness of joint, hand, right  Pain in right finger(s)  Other lack of coordination  Muscle weakness (generalized)  Localized edema  Rationale for Evaluation and Treatment: Rehabilitation  PERTINENT HISTORY: 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 Arthritis, bone spur  PRECAUTIONS: Other: per mallet finger protocol   WEIGHT BEARING RESTRICTIONS: No    SUBJECTIVE:   SUBJECTIVE STATEMENT: He states having some confusion.  He states his pain in his right hand initially started in January 2025 and appeared in the dorsum of his metacarpal  area.  He states that he had no pain in the DIP joint of his finger, and he could fully make a fist and extend his finger.  He does state having lumps on the back of his DIP joint for a long time.  These are likely Heberden's nodes from arthritis and must have somehow been confused for a mallet injury.  Somehow, he was given a brace to prevent his finger from moving and told that this was an issue with his mallet finger injury from over 30 years ago.  He started wearing bracing in January 2025 of different varieties and types, largely keeping his finger straight for too long and extended at more than just the DIP joint.  Now his finger presents macerated and wet, stiff and unable to bend, with the same Heberden's nodes dorsally but no significant tenderness at the DIP joint.  Due to these findings, OT decides that he should not be in a brace during the day as he needs to get his finger moving and it should not be casted, it also should be moving during the day as tolerated.  He can continue with his orthosis at night to help get a little better extension if he chooses, though his lag is insignificant at about 10 degrees.    PAIN:  Are you having pain? Not at rest in right hand, only painful when trying to bend stiff index finger.     PATIENT GOALS:  get my finger better  NEXT MD VISIT: a couple months ?    OBJECTIVE:  Note: Objective measures were completed at Evaluation unless otherwise noted.  HAND DOMINANCE: Right  ADLs: WFL  FUNCTIONAL OUTCOME MEASURES: 04/26/24: PSFS: 4.75 (writing, shower, bathroom, washing hands) (score out of 10, higher number equals better function)   Quick Dash: 41% deficit RUE  UPPER EXTREMITY ROM:   BUE AROM WFL's except Rt index finger - see below   Active ROM Rt  04/26/24  Index MCP (0-90) 0 -  58  Index PIP (0-100) (+10) -  56  Index DIP (0-70) (-10* ) - 36  (Blank rows = not tested)    HAND FUNCTION: Grip strength Rt: 22.7# tender; Lt: 73#    COORDINATION: Pt currently using other fingers to pick up small items  SENSATION: Not tested  EDEMA: TBA  COGNITION: Overall cognitive status: Pt demo difficulty following directions and needs multiple cues/review for carryover. Impaired memory  OBSERVATIONS:   04/26/24: Today his right index finger was macerated and wet, he has overt Heberden's nodes at the DIP joint dorsally, he has no significant lag in extension and shows good ability to extend the finger after he has flexed it.  He does not present like an acute mallet finger injury and indeed he does not describe having one for over 30 years.  He only has a minimal residual lag in extension (approx 10*), which would typically be considered a good outcome after an old mallet finger injury.  Instead he presents like a very stiff finger from wearing different bracing for the past 9 months, and he needs to get his finger moving.    TREATMENT:     04/26/24:   He was given copious education today on mallet finger injury acute versus chronic as well as stiffness, the effects of wearing bracing for 9+ months, and his pain and body mechanics.  OT then adjusts his mallet orthosis to allow better motion at the PIP joint.    For self-care/safety he was educated to only wear this at night or in the day if he does have pain in the day and the tip of his finger.  He should mainly be wearing this at night to help with a bit of extension.  Next, he was educated to lift nothing heavier than 5 or 10 pounds if it causes any pain with the right hand until it is less stiff.  He was also given the following home exercise program to try to remediate tightness and pain through his finger and also alleviate a bit of pain from arthritis as well.  He states understanding and demonstrates these back with no significant pain.  He states his finger feels a bit better at the end of the session.    Exercises - BACK KNUCKLE STRETCHES   - 4 x daily - 3-5 reps -  15 sec hold - Middle joint stretches  - 4-6 x daily - 1 sets - 3 reps - 10-15 sec hold - Seated Finger Composite Flexion Stretch  - 4 x daily - 3-5 reps - 15 hold - Tendon Glides  - 4-6 x daily - 10 reps - 2-3 seconds hold    PATIENT EDUCATION: Education details: see above Person educated: Patient Education method: Explanation, Demonstration, Tactile cues, Verbal cues, and Handouts Education comprehension: verbalized understanding, returned demonstration, verbal cues required, and needs further education  HOME EXERCISE PROGRAM: Access Code: [email or web address] URL: [email or web address] Date: 04/26/2024 Prepared by: Melvenia Ada  GOALS: Goals reviewed with patient? Yes  SHORT TERM GOALS: Target date: 05/11/24  Independent w/ splint wear and care Baseline: Goal status: IN PROGRESS  2.  Independent with initial HEP  Baseline:  Goal status: IN PROGRESS  3.  Pt to increase PIP flexion to 65* or greater Baseline: 40* Goal status: INITIAL   LONG TERM GOALS: Target date: 06/03/24  Independent with updated HEP for DIP ROM Baseline:  Goal status: INITIAL  2.  Pt to improve PIP flexion to 90* or greater for functional tasks Baseline:  Goal status: INITIAL  3.  Pt to return to using index finger for pincer grasp/picking up smaller objects Baseline:  Goal status: INITIAL  4.  Grip strength goal TBD Baseline:  Goal status: INITIAL   ASSESSMENT:  CLINICAL IMPRESSION: 04/26/24: He does not present like a significant mallet injury, but rather stiffness and arthritis.  He can still wear a nighttime orthosis to help his 10 degree lag (which is insignificant), but should not be wearing a brace in the day if he wants his finger to move better, and get his mobility and strength back.      PLAN:  OT FREQUENCY: 1x/week  OT DURATION: 6 weeks  PLANNED INTERVENTIONS: 97535 self care/ADL training, 02889 therapeutic exercise, 97530 therapeutic activity, 97140 manual  therapy, 97035 ultrasound, 97018 paraffin, 02960 fluidotherapy, 97010 moist heat, 97010 cryotherapy, 97034 contrast bath, 97760 Orthotic Initial, 97763 Orthotic/Prosthetic subsequent, passive range of motion, patient/family education, and DME and/or AE instructions   CONSULTED AND AGREED WITH PLAN OF CARE: Patient  PLAN FOR NEXT SESSION:   Check measurements next session and ensure that pain is coming down, stiffness is going down as well, finger still able to flex and extend.  Assigned light isometric grip training next session as tolerated.   Melvenia Ada, OTR/L, CHT 04/26/2024, 2:52 PM

## 2024-04-26 ENCOUNTER — Encounter: Payer: Self-pay | Admitting: Rehabilitative and Restorative Service Providers"

## 2024-04-26 ENCOUNTER — Ambulatory Visit (INDEPENDENT_AMBULATORY_CARE_PROVIDER_SITE_OTHER): Admitting: Rehabilitative and Restorative Service Providers"

## 2024-04-26 DIAGNOSIS — R278 Other lack of coordination: Secondary | ICD-10-CM

## 2024-04-26 DIAGNOSIS — M6281 Muscle weakness (generalized): Secondary | ICD-10-CM | POA: Diagnosis not present

## 2024-04-26 DIAGNOSIS — M25641 Stiffness of right hand, not elsewhere classified: Secondary | ICD-10-CM | POA: Diagnosis not present

## 2024-04-26 DIAGNOSIS — M79644 Pain in right finger(s): Secondary | ICD-10-CM | POA: Diagnosis not present

## 2024-04-26 DIAGNOSIS — R600 Localized edema: Secondary | ICD-10-CM

## 2024-05-03 NOTE — Therapy (Signed)
 OUTPATIENT OCCUPATIONAL THERAPY TREATMENT NOTE   Patient Name: Jason Dougherty MRN: [id number] DOB:[birth year], 53 y.o., male Today's Date: 05/04/2024  [facility]ERRING [facility]: Dr. Marsa Christen  END OF SESSION:  OT End of Session - 05/04/24 0854     Visit Number 3    Number of Visits 6    Date for Recertification  06/03/24    Authorization Type [facility] Auth # [id number]    Authorization Time Period 04/09/24 - 08/07/24    Authorization - Visit Number 3    Authorization - Number of Visits 15    OT Start Time 0854    OT Stop Time 0932    OT Time Calculation (min) 38 min    Equipment Utilized During Treatment Oval 8 orthosis    Activity Tolerance Patient tolerated treatment well;No increased pain;Patient limited by pain    Behavior During Therapy [facility] for tasks assessed/performed            Past Medical History:  Diagnosis Date   Allergic rhinitis    Arthritis    Bone spur    Past Surgical History:  Procedure Laterality Date   BACK SURGERY     CHOLECYSTECTOMY N/A 07/23/2021   Procedure: LAPAROSCOPIC CHOLECYSTECTOMY WITH INTRAOPERATIVE CHOLANGIOGRAM;  Surgeon: Vanderbilt Ned, MD;  Location: [facility];  Service: General;  Laterality: N/A;   HAND SURGERY     Patient Active Problem List   Diagnosis Date Noted   Thrombocytopenia 07/22/2021   Abnormal LFTs 07/22/2021   Cholelithiasis 07/22/2021   Acute gallstone pancreatitis 07/18/2021    ONSET DATE: chronic injury 30+ years ago   REFERRING DIAG: 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 Arthritis, bone spur He states having some confusion.  He states his pain in his right hand initially started in  January 2025 and appeared in the dorsum of his metacarpal area.  He states that he had no pain in the DIP joint of his finger, and he could fully make a fist and extend his finger.  He does state having lumps on the back of his DIP joint for a long time.  These are likely Heberden's nodes from arthritis and must have somehow been confused for a mallet injury.  Somehow, he was given a brace to prevent his finger from moving and told that this was an issue with his mallet finger injury from over 30 years ago.  He started wearing bracing in January 2025 of different varieties and types, largely keeping his finger straight for too long and extended at more than just the DIP joint.  Now his finger presents macerated and wet, stiff and unable to bend, with the same Heberden's nodes dorsally but no significant tenderness at the DIP joint.  Due to these findings, OT decides that he should not be in a brace during the day as he needs to get his finger moving and it should not be casted, it also should be moving during the day as tolerated.  He can continue with his orthosis at night to help get a little better extension if he chooses, though his lag is insignificant at about 10 degrees.   PRECAUTIONS: Other: per mallet finger protocol   WEIGHT BEARING  RESTRICTIONS: No    SUBJECTIVE:   SUBJECTIVE STATEMENT: He arrives without any orthoses, stating that he has some questions still.  He does state having better motion and less pain now.   PAIN:  Are you having pain?  Not at rest in right hand, only painful when trying to bend stiff index finger.     PATIENT GOALS: get my finger better  NEXT MD VISIT: a couple months ?    OBJECTIVE:  Note: Objective measures were completed at Evaluation unless otherwise noted.  HAND DOMINANCE: Right  ADLs: WFL  FUNCTIONAL OUTCOME MEASURES: 04/26/24: PSFS: 4.75 (writing, shower, bathroom, washing hands) (score out of 10, higher number equals better  function)   Quick Dash: 41% deficit RUE  UPPER EXTREMITY ROM:   BUE AROM WFL's except Rt index finger - see below   Active ROM Rt  04/26/24 Rt 05/04/24  Index MCP (0-90) 0 -  58 0 - 64  Index PIP (0-100) (+10) -  56 (+3) - 79  Index DIP (0-70) (-10* ) - 36 (-19)- 42  (Blank rows = not tested)    HAND FUNCTION: 05/04/24: Grip Rt: 36#   04/26/24: Grip strength Rt: 22.7# tender; Lt: 73#   COORDINATION: Pt currently using other fingers to pick up small items  SENSATION: Not tested  EDEMA: TBA  COGNITION: Eval: Overall cognitive status: Pt demo difficulty following directions and needs multiple cues/review for carryover. Impaired memory  OBSERVATIONS:   04/26/24: Today his right index finger was macerated and wet, he has overt Heberden's nodes at the DIP joint dorsally, he has no significant lag in extension and shows good ability to extend the finger after he has flexed it.  He does not present like an acute mallet finger injury and indeed he does not describe having one for over 30 years.  He only has a minimal residual lag in extension (approx 10*), which would typically be considered a good outcome after an old mallet finger injury.  Instead he presents like a very stiff finger from wearing different bracing for the past 9 months, and he needs to get his finger moving.    TREATMENT:     05/04/24: He starts with active range of motion for exercise as well as new measures which shows excellent improvement in his hand, though DIP joint is drooping a bit more.  This is expected as he is now moving and stretching his hand into flexion.  He was cautioned not to be overly aggressive or stretch to a painful limit which would attenuate his extensor tendon and cause his finger to look more drooped.  It was emphasized that he should wear his extension orthosis every night to help his DIP joint stretch straighter and help any attenuation of the extensor tendon.  To help with mild swan-neck  (typical developed with chronic mallet injuries, and somewhat present in his case) he was fitted with a custom oval 8 orthosis that allows PIP joint flexion but limits hyperextension simultaneously putting extra pressure to extend the DIP joint.  This worked very well for him today and he shows better DIP joint extension with this arm.  He was asked to please wear this in the day is much as he can stand but remove it if it is causing his finger to feel sore.  We reviewed his home exercise program with which he is doing well.  It was updated to the list below including a new, light isometric gripping activity.  This will help  build strength and allow for IP joint flexion.  He should not squeeze so hard that it causes pain.  He states understanding and leaves in no significant pain    Exercises - BACK KNUCKLE STRETCHES   - 4 x daily - 3-5 reps - 15 sec hold - Middle joint stretches  - 4-6 x daily - 1 sets - 3 reps - 10-15 sec hold - Seated Finger Composite Flexion Stretch  - 4 x daily - 3-5 reps - 15 hold - Tendon Glides  - 4-6 x daily - 10 reps - 2-3 seconds hold - Towel Roll Grip with Forearm in Neutral  - 3 x daily - 5 reps - 5 sec hold    PATIENT EDUCATION: Education details: see above Person educated: Patient Education method: Explanation, Demonstration, Tactile cues, Verbal cues, and Handouts Education comprehension: verbalized understanding, returned demonstration, verbal cues required, and needs further education  HOME EXERCISE PROGRAM: Access Code: [email or web address] URL: [email or web address] Date: 04/26/2024 Prepared by: Melvenia Ada   GOALS: Goals reviewed with patient? Yes  SHORT TERM GOALS: Target date: 05/11/24  Independent w/ splint wear and care Baseline: Goal status: IN PROGRESS  2.  Independent with initial HEP  Baseline:  Goal status: IN PROGRESS  3.  Pt to increase PIP flexion to 65* or greater Baseline: 40* Goal status: INITIAL   LONG TERM  GOALS: Target date: 06/03/24  Independent with updated HEP for DIP ROM Baseline:  Goal status: INITIAL  2.  Pt to improve PIP flexion to 90* or greater for functional tasks Baseline:  Goal status: INITIAL  3.  Pt to return to using index finger for pincer grasp/picking up smaller objects Baseline:  Goal status: INITIAL  4.  Grip strength goal TBD Baseline:  Goal status: INITIAL   ASSESSMENT:  CLINICAL IMPRESSION: 9/32/25: He is doing much better, though the droop did worsen mildly.  We will continue to monitor that as well as his ability to make a fist and his strength.  This is a delicate balance. New Oval 8 orthosis should be greatly helpful.   04/26/24: He does not present like a significant mallet injury, but rather stiffness and arthritis.  He can still wear a nighttime orthosis to help his 10 degree lag (which is insignificant), but should not be wearing a brace in the day if he wants his finger to move better, and get his mobility and strength back.      PLAN:  OT FREQUENCY: 1x/week  OT DURATION: 6 weeks  PLANNED INTERVENTIONS: 97535 self care/ADL training, 02889 therapeutic exercise, 97530 therapeutic activity, 97140 manual therapy, 97035 ultrasound, 97018 paraffin, 02960 fluidotherapy, 97010 moist heat, 97010 cryotherapy, 97034 contrast bath, 97760 Orthotic Initial, 97763 Orthotic/Prosthetic subsequent, passive range of motion, patient/family education, and DME and/or AE instructions   CONSULTED AND AGREED WITH PLAN OF CARE: Patient  PLAN FOR NEXT SESSION:   Check new oval 8 orthosis, check droop as well as fist and strength.   Melvenia Ada, OTR/L, CHT 05/04/2024, 4:44 PM

## 2024-05-04 ENCOUNTER — Ambulatory Visit (INDEPENDENT_AMBULATORY_CARE_PROVIDER_SITE_OTHER): Admitting: Rehabilitative and Restorative Service Providers"

## 2024-05-04 ENCOUNTER — Encounter: Payer: Self-pay | Admitting: Rehabilitative and Restorative Service Providers"

## 2024-05-04 DIAGNOSIS — R278 Other lack of coordination: Secondary | ICD-10-CM | POA: Diagnosis not present

## 2024-05-04 DIAGNOSIS — M79644 Pain in right finger(s): Secondary | ICD-10-CM

## 2024-05-04 DIAGNOSIS — M25641 Stiffness of right hand, not elsewhere classified: Secondary | ICD-10-CM | POA: Diagnosis not present

## 2024-05-04 DIAGNOSIS — M6281 Muscle weakness (generalized): Secondary | ICD-10-CM

## 2024-05-04 DIAGNOSIS — R600 Localized edema: Secondary | ICD-10-CM

## 2024-05-07 NOTE — Therapy (Signed)
 OUTPATIENT OCCUPATIONAL THERAPY TREATMENT NOTE   Patient Name: Jason Dougherty MRN: [id number] DOB:[birth year], 53 y.o., male Today's Date: 05/11/2024  [facility]ING PROVIDER: Dr. Marsa Christen  END OF SESSION:  OT End of Session - 05/11/24 0806     Visit Number 4    Number of Visits 6    Date for Recertification  06/03/24    Authorization Type [facility] Auth # [id number]    Authorization Time Period 04/09/24 - 08/07/24    Authorization - Number of Visits 15    OT Start Time 0806    OT Stop Time 0844    OT Time Calculation (min) 38 min    Equipment Utilized During Treatment Oval 8 orthosis    Activity Tolerance Patient tolerated treatment well;No increased pain;Patient limited by pain    Behavior During Therapy [facility] for tasks assessed/performed            Past Medical History:  Diagnosis Date   Allergic rhinitis    Arthritis    Bone spur    Past Surgical History:  Procedure Laterality Date   BACK SURGERY     CHOLECYSTECTOMY N/A 07/23/2021   Procedure: LAPAROSCOPIC CHOLECYSTECTOMY WITH INTRAOPERATIVE CHOLANGIOGRAM;  Surgeon: Vanderbilt Ned, MD;  Location: [facility];  Service: General;  Laterality: N/A;   HAND SURGERY     Patient Active Problem List   Diagnosis Date Noted   Thrombocytopenia 07/22/2021   Abnormal LFTs 07/22/2021   Cholelithiasis 07/22/2021   Acute gallstone pancreatitis 07/18/2021    ONSET DATE: chronic injury 30+ years ago   REFERRING DIAG: 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 Arthritis, bone spur He states having some confusion.  He states his pain in his right hand initially started in January 2025 and appeared in the  dorsum of his metacarpal area.  He states that he had no pain in the DIP joint of his finger, and he could fully make a fist and extend his finger.  He does state having lumps on the back of his DIP joint for a long time.  These are likely Heberden's nodes from arthritis and must have somehow been confused for a mallet injury.  Somehow, he was given a brace to prevent his finger from moving and told that this was an issue with his mallet finger injury from over 30 years ago.  He started wearing bracing in January 2025 of different varieties and types, largely keeping his finger straight for too long and extended at more than just the DIP joint.  Now his finger presents macerated and wet, stiff and unable to bend, with the same Heberden's nodes dorsally but no significant tenderness at the DIP joint.  Due to these findings, OT decides that he should not be in a brace during the day as he needs to get his finger moving and it should not be casted, it also should be moving during the day as tolerated.  He can continue with his orthosis at night to help get a little better extension if he chooses, though his lag is insignificant at about 10 degrees.   PRECAUTIONS: Other: per mallet finger protocol   WEIGHT BEARING RESTRICTIONS: No    SUBJECTIVE:  SUBJECTIVE STATEMENT: He states no pain, brings orthoses in to get adjusted.     PAIN:  Are you having pain?  Not at rest in right hand, only painful when trying to bend stiff index finger.     PATIENT GOALS: get my finger better  NEXT MD VISIT: a couple months ?    OBJECTIVE:  Note: Objective measures were completed at Evaluation unless otherwise noted.  HAND DOMINANCE: Right  ADLs: WFL  FUNCTIONAL OUTCOME MEASURES: 04/26/24: PSFS: 4.75 (writing, shower, bathroom, washing hands) (score out of 10, higher number equals better function)   Quick Dash: 41% deficit RUE  UPPER EXTREMITY ROM:   BUE AROM WFL's except Rt index finger - see  below   Active ROM Rt  04/26/24 Rt 05/04/24 Rt 05/11/24  Index MCP (0-90) 0 -  58 0 - 64 0 - 67  Index PIP (0-100) (+10) -  56 (+3) - 79 0 - 94  Index DIP (0-70) (-10* ) - 36 (-19)- 42 (-13) - 55  (Blank rows = not tested)    HAND FUNCTION: 05/11/24: Grip Rt: 75#   05/04/24: Grip Rt: 36#   04/26/24: Grip strength Rt: 22.7# tender; Lt: 73#   COORDINATION: Pt currently using other fingers to pick up small items   COGNITION: Eval: Overall cognitive status: Pt demo difficulty following directions and needs multiple cues/review for carryover. Impaired memory  OBSERVATIONS:   04/26/24: Today his right index finger was macerated and wet, he has overt Heberden's nodes at the DIP joint dorsally, he has no significant lag in extension and shows good ability to extend the finger after he has flexed it.  He does not present like an acute mallet finger injury and indeed he does not describe having one for over 30 years.  He only has a minimal residual lag in extension (approx 10*), which would typically be considered a good outcome after an old mallet finger injury.  Instead he presents like a very stiff finger from wearing different bracing for the past 9 months, and he needs to get his finger moving.    TREATMENT:     05/11/24: He starts with active range of motion showing excellent improvements in total active motion.  His finger appears to be drooping less.  He still has notable Heberden's nodes at DIP, which OT again explains his arthritis and will likely not go away.  His strength is greatly improved now as well.  We reviewed his home exercise program, he performs it back for understanding.  OT tells him to not grip anything that is very painful or difficult but that he can be doing as much as tolerated now.  OT feels like this issue is improving with orthosis wear and time and HEP.  Due to that, he suggested the patient take a week off and return in 2 weeks.  He is in agreement.  Orthoses  were checked and modified to provide better DIP joint extension in the night, padding was added.  Daytime oval 8 orthosis was also slightly adjusted for a better fit and better function.  He should remove these if they cause irritation to allow his skin to rest, otherwise wear as much as tolerated.    Exercises reviewed today: - BACK KNUCKLE STRETCHES   - 4 x daily - 3-5 reps - 15 sec hold - Middle joint stretches  - 4-6 x daily - 1 sets - 3 reps - 10-15 sec hold - Seated Finger Composite Flexion Stretch  -  4 x daily - 3-5 reps - 15 hold - Tendon Glides  - 4-6 x daily - 10 reps - 2-3 seconds hold - Towel Roll Grip with Forearm in Neutral  - 3 x daily - 5 reps - 5 sec hold    PATIENT EDUCATION: Education details: see above Person educated: Patient Education method: Explanation, Demonstration, Tactile cues, Verbal cues, and Handouts Education comprehension: verbalized understanding, returned demonstration, verbal cues required, and needs further education  HOME EXERCISE PROGRAM: Access Code: [email or web address] URL: [email or web address] Date: 04/26/2024 Prepared by: Melvenia Ada   GOALS: Goals reviewed with patient? Yes  SHORT TERM GOALS: Target date: 05/11/24  Independent w/ splint wear and care Baseline: Goal status: IN PROGRESS  2.  Independent with initial HEP  Baseline:  Goal status: IN PROGRESS  3.  Pt to increase PIP flexion to 65* or greater Baseline: 40* Goal status: INITIAL   LONG TERM GOALS: Target date: 06/03/24  Independent with updated HEP for DIP ROM Baseline:  Goal status: INITIAL  2.  Pt to improve PIP flexion to 90* or greater for functional tasks Baseline:  Goal status: INITIAL  3.  Pt to return to using index finger for pincer grasp/picking up smaller objects Baseline:  Goal status: INITIAL  4.  Grip strength goal TBD Baseline:  Goal status: INITIAL   ASSESSMENT:  CLINICAL IMPRESSION: 05/11/24:  TAM is up from 150* - 203*, grip  strength is greatly improved, recovery has been somewhat slow but very steady.  We decided to give him a break from therapy next week and return in 2 weeks at which time more progress should be seen.  He may be able to discharge at that point if all goals are met   9/32/25: He is doing much better, though the droop did worsen mildly.  We will continue to monitor that as well as his ability to make a fist and his strength.  This is a delicate balance. New Oval 8 orthosis should be greatly helpful.     PLAN:  OT FREQUENCY: 1x/week  OT DURATION: 6 weeks  PLANNED INTERVENTIONS: 97535 self care/ADL training, 02889 therapeutic exercise, 97530 therapeutic activity, 97140 manual therapy, 97035 ultrasound, 97018 paraffin, 02960 fluidotherapy, 97010 moist heat, 97010 cryotherapy, 97034 contrast bath, 97760 Orthotic Initial, 97763 Orthotic/Prosthetic subsequent, passive range of motion, patient/family education, and DME and/or AE instructions   CONSULTED AND AGREED WITH PLAN OF CARE: Patient  PLAN FOR NEXT SESSION:   Follow-up in 2 weeks for a check of status and possible discharge if all goals are met.   Melvenia Ada, OTR/L, CHT 05/11/2024, 10:57 AM

## 2024-05-11 ENCOUNTER — Ambulatory Visit (INDEPENDENT_AMBULATORY_CARE_PROVIDER_SITE_OTHER): Admitting: Rehabilitative and Restorative Service Providers"

## 2024-05-11 ENCOUNTER — Encounter: Payer: Self-pay | Admitting: Rehabilitative and Restorative Service Providers"

## 2024-05-11 DIAGNOSIS — M25641 Stiffness of right hand, not elsewhere classified: Secondary | ICD-10-CM | POA: Diagnosis not present

## 2024-05-11 DIAGNOSIS — M6281 Muscle weakness (generalized): Secondary | ICD-10-CM | POA: Diagnosis not present

## 2024-05-11 DIAGNOSIS — R278 Other lack of coordination: Secondary | ICD-10-CM | POA: Diagnosis not present

## 2024-05-11 DIAGNOSIS — M79644 Pain in right finger(s): Secondary | ICD-10-CM

## 2024-05-11 DIAGNOSIS — R600 Localized edema: Secondary | ICD-10-CM

## 2024-05-18 ENCOUNTER — Encounter: Admitting: Rehabilitative and Restorative Service Providers"

## 2024-05-24 NOTE — Therapy (Signed)
 OUTPATIENT OCCUPATIONAL THERAPY TREATMENT NOTE   Patient Name: Jason Dougherty MRN: [id number] DOB:08-20-1970, 53 y.o., male Today's Date: 05/24/2024  PC[facility]: [facility] REFERRING PROVIDER: Dr. Marsa Christen  END OF SESSION:      Past Medical History:  Diagnosis Date   Allergic rhinitis    Arthritis    Bone spur    Past Surgical History:  Procedure Laterality Date   BACK SURGERY     CHOLECYSTECTOMY N/A 07/23/2021   Procedure: LAPAROSCOPIC CHOLECYSTECTOMY WITH INTRAOPERATIVE CHOLANGIOGRAM;  Surgeon: Vanderbilt Ned, MD;  Location: [facility];  Service: General;  Laterality: N/A;   HAND SURGERY     Patient Active Problem List   Diagnosis Date Noted   Thrombocytopenia 07/22/2021   Abnormal LFTs 07/22/2021   Cholelithiasis 07/22/2021   Acute gallstone pancreatitis 07/18/2021    ONSET DATE: chronic injury 30+ years ago   REFERRING DIAG: 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 Arthritis, bone spur He states having some confusion.  He states his pain in his right hand initially started in January 2025 and appeared in the dorsum of his metacarpal area.  He states that he had no pain in the DIP joint of his finger, and he could fully make a fist and extend his finger.  He does state having lumps on the back of his DIP joint for a long time.  These are likely Heberden's nodes from arthritis and must have somehow been confused for a mallet injury.  Somehow, he was given a brace to prevent his finger from moving and told that this was an issue with his mallet finger injury from over 30 years ago.  He started wearing bracing in January 2025 of different varieties and types, largely keeping his finger straight for too long and extended at more than just the DIP joint.  Now his finger presents  macerated and wet, stiff and unable to bend, with the same Heberden's nodes dorsally but no significant tenderness at the DIP joint.  Due to these findings, OT decides that he should not be in a brace during the day as he needs to get his finger moving and it should not be casted, it also should be moving during the day as tolerated.  He can continue with his orthosis at night to help get a little better extension if he chooses, though his lag is insignificant at about 10 degrees.   PRECAUTIONS: Other: per mallet finger protocol   WEIGHT BEARING RESTRICTIONS: No    SUBJECTIVE:   SUBJECTIVE STATEMENT: He states ***    no pain, brings orthoses in to get adjusted.     PAIN:  Are you having pain?  *** Not at rest in right hand, only painful when trying to bend stiff index finger.     PATIENT GOALS: get my finger better  NEXT MD VISIT: a couple months ?    OBJECTIVE:  Note: Objective measures were completed at Evaluation unless otherwise noted.  HAND DOMINANCE: Right  ADLs: WFL  FUNCTIONAL OUTCOME MEASURES: 05/25/24 PSFS: ***   04/26/24: PSFS: 4.75 (writing, shower, bathroom, washing hands) (score out of 10, higher number equals better function)   Quick Dash: 41% deficit RUE  UPPER EXTREMITY ROM:   BUE AROM WFL's except Rt index finger - see below   Active ROM Rt  04/26/24  Rt 05/04/24 Rt 05/11/24 Rt 05/25/24  Index MCP (0-90) 0 -  58 0 - 64 0 - 67 0 - ***  Index PIP (0-100) (+10) -  56 (+3) - 79 0 - 94 0 - ***  Index DIP (0-70) (-10* ) - 36 (-19)- 42 (-13) - 55 *** - ***  (Blank rows = not tested)    HAND FUNCTION: 05/25/24: Grip Rt: ***#    05/11/24: Grip Rt: 75#   05/04/24: Grip Rt: 36#   04/26/24: Grip strength Rt: 22.7# tender; Lt: 73#   COORDINATION: Pt currently using other fingers to pick up small items   COGNITION: Eval: Overall cognitive status: Pt demo difficulty following directions and needs multiple cues/review for carryover. Impaired  memory  OBSERVATIONS:   04/26/24: Today his right index finger was macerated and wet, he has overt Heberden's nodes at the DIP joint dorsally, he has no significant lag in extension and shows good ability to extend the finger after he has flexed it.  He does not present like an acute mallet finger injury and indeed he does not describe having one for over 30 years.  He only has a minimal residual lag in extension (approx 10*), which would typically be considered a good outcome after an old mallet finger injury.  Instead he presents like a very stiff finger from wearing different bracing for the past 9 months, and he needs to get his finger moving.    TREATMENT:     05/25/24: *** Follow-up in 2 weeks for a check of status and possible discharge if all goals are met.    05/11/24: He starts with active range of motion showing excellent improvements in total active motion.  His finger appears to be drooping less.  He still has notable Heberden's nodes at DIP, which OT again explains his arthritis and will likely not go away.  His strength is greatly improved now as well.  We reviewed his home exercise program, he performs it back for understanding.  OT tells him to not grip anything that is very painful or difficult but that he can be doing as much as tolerated now.  OT feels like this issue is improving with orthosis wear and time and HEP.  Due to that, he suggested the patient take a week off and return in 2 weeks.  He is in agreement.  Orthoses were checked and modified to provide better DIP joint extension in the night, padding was added.  Daytime oval 8 orthosis was also slightly adjusted for a better fit and better function.  He should remove these if they cause irritation to allow his skin to rest, otherwise wear as much as tolerated.    Exercises reviewed today: - BACK KNUCKLE STRETCHES   - 4 x daily - 3-5 reps - 15 sec hold - Middle joint stretches  - 4-6 x daily - 1 sets - 3 reps - 10-15  sec hold - Seated Finger Composite Flexion Stretch  - 4 x daily - 3-5 reps - 15 hold - Tendon Glides  - 4-6 x daily - 10 reps - 2-3 seconds hold - Towel Roll Grip with Forearm in Neutral  - 3 x daily - 5 reps - 5 sec hold    PATIENT EDUCATION: Education details: see above Person educated: Patient Education method: Explanation, Demonstration, Tactile cues, Verbal cues, and Handouts Education comprehension: verbalized understanding, returned demonstration, verbal cues required, and needs further education  HOME EXERCISE PROGRAM: Access Code: [email or web address] URL: [email or web address] Date:  04/26/2024 Prepared by: Melvenia Ada   GOALS: Goals reviewed with patient? Yes  SHORT TERM GOALS: Target date: 05/11/24  Independent w/ splint wear and care Baseline: Goal status: 05/25/24: ***  2.  Independent with initial HEP  Baseline:  Goal status: I10/14/25: ***  3.  Pt to increase PIP flexion to 65* or greater Baseline: 40* Goal status: 05/25/24: ***   LONG TERM GOALS: Target date: 06/03/24  Independent with updated HEP for DIP ROM Baseline:  Goal status: 05/25/24: ***  2.  Pt to improve PIP flexion to 90* or greater for functional tasks Baseline:  Goal status: 05/25/24: ***  3.  Pt to return to using index finger for pincer grasp/picking up smaller objects Baseline:  Goal status:05/25/24: ***  4.  Grip strength goal TBD Baseline:  Goal status: 05/25/24: ***   ASSESSMENT:  CLINICAL IMPRESSION: 05/25/24: ***  05/11/24:  TAM is up from 150* - 203*, grip strength is greatly improved, recovery has been somewhat slow but very steady.  We decided to give him a break from therapy next week and return in 2 weeks at which time more progress should be seen.  He may be able to discharge at that point if all goals are met    PLAN:  OT FREQUENCY: 1x/week  OT DURATION: 6 weeks  PLANNED INTERVENTIONS: 97535 self care/ADL training, 02889 therapeutic exercise,  97530 therapeutic activity, 97140 manual therapy, 97035 ultrasound, 97018 paraffin, 02960 fluidotherapy, 97010 moist heat, 97010 cryotherapy, 97034 contrast bath, 97760 Orthotic Initial, 97763 Orthotic/Prosthetic subsequent, passive range of motion, patient/family education, and DME and/or AE instructions   CONSULTED AND AGREED WITH PLAN OF CARE: Patient  PLAN FOR NEXT SESSION:   ***  Melvenia Ada, OTR/L, CHT 05/24/2024, 8:15 AM

## 2024-05-25 ENCOUNTER — Ambulatory Visit (INDEPENDENT_AMBULATORY_CARE_PROVIDER_SITE_OTHER): Admitting: Rehabilitative and Restorative Service Providers"

## 2024-05-25 ENCOUNTER — Encounter: Payer: Self-pay | Admitting: Rehabilitative and Restorative Service Providers"

## 2024-05-25 DIAGNOSIS — M25641 Stiffness of right hand, not elsewhere classified: Secondary | ICD-10-CM

## 2024-05-25 DIAGNOSIS — M6281 Muscle weakness (generalized): Secondary | ICD-10-CM

## 2024-05-25 DIAGNOSIS — R278 Other lack of coordination: Secondary | ICD-10-CM | POA: Diagnosis not present

## 2024-05-25 DIAGNOSIS — M79644 Pain in right finger(s): Secondary | ICD-10-CM | POA: Diagnosis not present

## 2024-05-25 DIAGNOSIS — R600 Localized edema: Secondary | ICD-10-CM
